# Patient Record
Sex: Female | Born: 1965 | Race: White | Hispanic: No | Smoking: Never smoker
Health system: Southern US, Community
[De-identification: ages and names within clinical notes are randomized; demographics above are authoritative.]

## PROBLEM LIST (undated history)

## (undated) DIAGNOSIS — F410 Panic disorder [episodic paroxysmal anxiety] without agoraphobia: Secondary | ICD-10-CM

## (undated) DIAGNOSIS — F419 Anxiety disorder, unspecified: Secondary | ICD-10-CM

## (undated) DIAGNOSIS — K589 Irritable bowel syndrome without diarrhea: Secondary | ICD-10-CM

## (undated) DIAGNOSIS — F32A Depression, unspecified: Secondary | ICD-10-CM

## (undated) DIAGNOSIS — R0602 Shortness of breath: Secondary | ICD-10-CM

## (undated) DIAGNOSIS — I10 Essential (primary) hypertension: Secondary | ICD-10-CM

## (undated) DIAGNOSIS — J069 Acute upper respiratory infection, unspecified: Secondary | ICD-10-CM

## (undated) DIAGNOSIS — Z889 Allergy status to unspecified drugs, medicaments and biological substances status: Secondary | ICD-10-CM

## (undated) DIAGNOSIS — R252 Cramp and spasm: Secondary | ICD-10-CM

## (undated) DIAGNOSIS — T7840XA Allergy, unspecified, initial encounter: Secondary | ICD-10-CM

## (undated) DIAGNOSIS — F329 Major depressive disorder, single episode, unspecified: Secondary | ICD-10-CM

## (undated) HISTORY — DX: Allergy, unspecified, initial encounter: T78.40XA

## (undated) HISTORY — PX: WISDOM TOOTH EXTRACTION: SHX21

## (undated) HISTORY — DX: Depression, unspecified: F32.A

## (undated) HISTORY — PX: RHINOPLASTY: SUR1284

## (undated) HISTORY — DX: Major depressive disorder, single episode, unspecified: F32.9

## (undated) HISTORY — PX: TUBAL LIGATION: SHX77

## (undated) HISTORY — PX: DILATION AND CURETTAGE OF UTERUS: SHX78

---

## 1998-10-04 ENCOUNTER — Emergency Department (HOSPITAL_COMMUNITY): Admission: EM | Admit: 1998-10-04 | Discharge: 1998-10-04 | Payer: Self-pay | Admitting: Emergency Medicine

## 1999-03-09 ENCOUNTER — Emergency Department (HOSPITAL_COMMUNITY): Admission: EM | Admit: 1999-03-09 | Discharge: 1999-03-09 | Payer: Self-pay | Admitting: Emergency Medicine

## 2006-12-06 ENCOUNTER — Other Ambulatory Visit: Admission: RE | Admit: 2006-12-06 | Discharge: 2006-12-06 | Payer: Self-pay | Admitting: Gynecology

## 2007-04-06 ENCOUNTER — Other Ambulatory Visit: Admission: RE | Admit: 2007-04-06 | Discharge: 2007-04-06 | Payer: Self-pay | Admitting: Gynecology

## 2008-01-03 ENCOUNTER — Encounter: Admission: RE | Admit: 2008-01-03 | Discharge: 2008-01-03 | Payer: Self-pay | Admitting: Internal Medicine

## 2008-01-10 ENCOUNTER — Other Ambulatory Visit: Admission: RE | Admit: 2008-01-10 | Discharge: 2008-01-10 | Payer: Self-pay | Admitting: Gynecology

## 2008-09-13 ENCOUNTER — Ambulatory Visit: Payer: Self-pay | Admitting: Internal Medicine

## 2008-10-02 ENCOUNTER — Ambulatory Visit: Payer: Self-pay | Admitting: Internal Medicine

## 2009-10-04 ENCOUNTER — Ambulatory Visit: Payer: Self-pay | Admitting: Internal Medicine

## 2009-10-28 ENCOUNTER — Encounter: Admission: RE | Admit: 2009-10-28 | Discharge: 2009-11-20 | Payer: Self-pay | Admitting: Endocrinology

## 2009-12-16 ENCOUNTER — Encounter: Admission: RE | Admit: 2009-12-16 | Discharge: 2010-03-16 | Payer: Self-pay | Admitting: Endocrinology

## 2010-02-17 ENCOUNTER — Ambulatory Visit: Payer: Self-pay | Admitting: Internal Medicine

## 2010-08-13 DIAGNOSIS — R0602 Shortness of breath: Secondary | ICD-10-CM

## 2010-08-13 DIAGNOSIS — R5383 Other fatigue: Secondary | ICD-10-CM

## 2010-08-13 DIAGNOSIS — L659 Nonscarring hair loss, unspecified: Secondary | ICD-10-CM | POA: Insufficient documentation

## 2010-08-13 DIAGNOSIS — R635 Abnormal weight gain: Secondary | ICD-10-CM

## 2010-08-13 DIAGNOSIS — R5381 Other malaise: Secondary | ICD-10-CM

## 2010-08-13 DIAGNOSIS — I10 Essential (primary) hypertension: Secondary | ICD-10-CM | POA: Insufficient documentation

## 2010-08-13 DIAGNOSIS — J45909 Unspecified asthma, uncomplicated: Secondary | ICD-10-CM | POA: Insufficient documentation

## 2010-08-13 DIAGNOSIS — R062 Wheezing: Secondary | ICD-10-CM

## 2010-08-13 DIAGNOSIS — R002 Palpitations: Secondary | ICD-10-CM

## 2010-08-13 DIAGNOSIS — R197 Diarrhea, unspecified: Secondary | ICD-10-CM

## 2010-08-13 DIAGNOSIS — K589 Irritable bowel syndrome without diarrhea: Secondary | ICD-10-CM

## 2010-08-13 DIAGNOSIS — E119 Type 2 diabetes mellitus without complications: Secondary | ICD-10-CM

## 2010-08-13 DIAGNOSIS — R609 Edema, unspecified: Secondary | ICD-10-CM

## 2010-08-13 DIAGNOSIS — R109 Unspecified abdominal pain: Secondary | ICD-10-CM

## 2010-08-14 ENCOUNTER — Ambulatory Visit: Payer: Self-pay | Admitting: Cardiology

## 2010-08-14 DIAGNOSIS — R079 Chest pain, unspecified: Secondary | ICD-10-CM | POA: Insufficient documentation

## 2010-08-22 ENCOUNTER — Telehealth (INDEPENDENT_AMBULATORY_CARE_PROVIDER_SITE_OTHER): Payer: Self-pay | Admitting: *Deleted

## 2010-09-11 ENCOUNTER — Ambulatory Visit (HOSPITAL_COMMUNITY): Admission: RE | Admit: 2010-09-11 | Discharge: 2010-09-11 | Payer: Self-pay | Admitting: Obstetrics and Gynecology

## 2010-09-19 ENCOUNTER — Ambulatory Visit: Payer: Self-pay | Admitting: Cardiology

## 2010-11-05 ENCOUNTER — Encounter
Admission: RE | Admit: 2010-11-05 | Discharge: 2010-11-05 | Payer: Self-pay | Source: Home / Self Care | Attending: Endocrinology | Admitting: Endocrinology

## 2010-12-25 NOTE — Progress Notes (Signed)
Summary: event monitor  Phone Note Outgoing Call Call back at [facility] Phone [phone]   Call placed by: Stanton Kidney, EMT-P,  August 22, 2010 1:37 PM Summary of Call: Left message for Pt. to call to schedule for event monitor. Left message for pt ref: event monitor. Stanton Kidney, EMT-P  September 16, 2010 9:16 AM  S/W pt, mail event monitor requested by pt, enrolled 09/19/10, Stanton Kidney, EMT-P  September 19, 2010 12:24 PM

## 2010-12-25 NOTE — Assessment & Plan Note (Signed)
Summary: np6/family hx of Cardiovascular diease/jml   Visit Type:  new pt visit Referring Provider:  Dr. Henderson Cloud Primary Provider:  [facility]  CC:  pt states she is going to have another D&C w/Dr. Henderson Cloud Oct 20 and 2011 at [facility]......chest tightness...sob w/stairs....pt states her legs hurt when she walks upstairs...edema/feet at times......  History of Present Illness: Janet Ware comes in today for consultation for chest discomfort and palpitations.  She is 45 years of age. She is noticed in the past some palpitations and some chest pressure. The palpitations are usually at random. She is noted on several occasions where she awakes and her heart rates running 168 on her monitor. It appears to be regular. She doesn't recall her breaking quickly.  She is also some chest pressure without radiation. This is usually at rest but cannot occasion occur with exertion. She does try to exercise a couple times a week.  Her risk factors for cardiac disease include remote smoking in college, obesity, and family history of coronary disease. Specifically her father had a cardiac event and died at age 54.  She denies any history of hypertension. She was told not to long ago that her hemoglobin A1c was just above 6%.  She had a recent evaluation by Dr. [place] of cardiology. A treadmill was negative according to her. She was told to stay on the metoprolol which seems to help her palpitations.  She does not use any stimulants, but does have a little bit caffeine on occasion. Her thyroid was recently checked and was normal.   Current Medications (verified): 1)  Advair Diskus 250-50 Mcg/dose Aepb (Fluticasone-Salmeterol) .Marland Kitchen.. 1 Puff Two Times A Day 2)  Proair Hfa 108 (90 Base) Mcg/act Aers (Albuterol Sulfate) .... As Needed 3)  Lorazepam 0.5 Mg Tabs (Lorazepam) .... As Needed 4)  Metoprolol Succinate 25 Mg Xr24h-Tab (Metoprolol Succinate) .Marland Kitchen.. 1 Tab Once Daily 5)  Metformin Hcl 1000  Mg Tabs (Metformin Hcl) .Marland Kitchen.. 1 Tab Two Times A Day  Allergies (verified): No Known Drug Allergies  Past History:  Past Medical History: Last updated: 08/13/2010 HYPERTENSION (ICD-401.9) PALPITATIONS (ICD-785.1) FATIGUE (ICD-780.79) DYSPNEA (ICD-786.05) EDEMA (ICD-782.3) DIARRHEA (ICD-787.91) WHEEZING (ICD-786.07) WEIGHT GAIN (ICD-783.1) HAIR LOSS (ICD-704.00) ASTHMA (ICD-493.90) DIABETES MELLITUS, TYPE II (ICD-250.00) IBS (ICD-564.1) PELVIC  PAIN (ICD-789.09)    Past Surgical History: Last updated: 08/13/2010 Hysteroscopy/D&C Endometrial Bx Sonohysterogram  Family History: Last updated: 08/13/2010 Family History of Coronary Artery Disease:  Family History of Cancer:  Colon  Social History: Last updated: 08/13/2010 Divorced  Tobacco Use - No.  Alcohol Use - yes Drug Use - no  Risk Factors: Smoking Status: never (08/13/2010)  Review of Systems       negative other than history of present illness  Vital Signs:  Patient profile:   45 year old female Height:      67 inches Weight:      190.8 pounds BMI:     29.99 Pulse rate:   86 / minute Pulse rhythm:   regular BP sitting:   128 / 74  (left arm) Cuff size:   large  Vitals Entered By: Danielle Rankin, CMA (August 14, 2010 10:51 AM)  Physical Exam  General:  overweight, pleasant, no acute distress Head:  normocephalic and atraumatic Eyes:  PERRLA/EOM intact; conjunctiva and lids normal. Neck:  Neck supple, no JVD. No masses, thyromegaly or abnormal cervical nodes. Chest Wall:  no deformities or breast masses noted Lungs:  Clear bilaterally to auscultation and percussion. Heart:  PMI  nondisplaced, normal S1-S2, no murmur or gallop. Carotids equal bilaterally without bruits Abdomen:  Bowel sounds positive; abdomen soft and non-tender without masses, organomegaly, or hernias noted. No hepatosplenomegaly. Msk:  Back normal, normal gait. Muscle strength and tone normal. Pulses:  pulses normal in all 4  extremities Extremities:  No clubbing or cyanosis. Neurologic:  Alert and oriented x 3. Skin:  Intact without lesions or rashes. Psych:  Normal affect.   Problems:  Medical Problems Added: 1)  Dx of Chest Pain-unspecified  (ICD-786.50)  EKG  Procedure date:  08/14/2010  Findings:      normal sinus rhythm, normal EKG  Impression & Recommendations:  Problem # 1:  PALPITATIONS (ICD-785.1) Assessment Deteriorated The episode she is at night which are now occurring weekly may be SVT. We'll place an event recorder and we capture one. I've advised her to increase her metoprolol to 50 mg a day if the palpitations become worse. Her updated medication list for this problem includes:    Metoprolol Succinate 25 Mg Xr24h-tab (Metoprolol succinate) .Marland Kitchen... 1 tab once daily  Orders: Holter Monitor (Holter Monitor)  Problem # 2:  CHEST PAIN-UNSPECIFIED (ICD-786.50) Assessment: New I doubt this is coronary. I reviewed the symptoms of angina and ischemic equivalence. Her weight is a major factor down the road. Advised to keep her hemoglobin A1c as low as possible. The last one was below 6%. Regular exercise also important 3 hours a week. Her lipids she has been told by endocrinology are fine. Reassurance given Her updated medication list for this problem includes:    Metoprolol Succinate 25 Mg Xr24h-tab (Metoprolol succinate) .Marland Kitchen... 1 tab once daily  Patient Instructions: 1)  Your physician recommends that you schedule a follow-up appointment as needed  2)  Your physician recommends that you continue on your current medications as directed. Please refer to the Current Medication list given to you today. 3)  Your physician has recommended that you wear an event monitor.  Event monitors are medical devices that record the heart's electrical activity. Doctors most often use these monitors to diagnose arrhythmias. Arrhythmias are problems with the speed or rhythm of the heartbeat. The monitor is a  small, portable device. You can wear one while you do your normal daily activities. This is usually used to diagnose what is causing palpitations/syncope (passing out).

## 2011-02-04 LAB — HCG, SERUM, QUALITATIVE: Preg, Serum: NEGATIVE

## 2011-02-05 LAB — SURGICAL PCR SCREEN
MRSA, PCR: NEGATIVE
Staphylococcus aureus: NEGATIVE

## 2011-02-05 LAB — CBC
HCT: 40.2 % (ref 36.0–46.0)
Hemoglobin: 13.7 g/dL (ref 12.0–15.0)
MCH: 32.7 pg (ref 26.0–34.0)
MCHC: 34 g/dL (ref 30.0–36.0)
RDW: 12.6 % (ref 11.5–15.5)

## 2011-02-05 LAB — COMPREHENSIVE METABOLIC PANEL
CO2: 28 mEq/L (ref 19–32)
Calcium: 9 mg/dL (ref 8.4–10.5)
Creatinine, Ser: 0.62 mg/dL (ref 0.4–1.2)
GFR calc non Af Amer: >60 mL/min (ref >60)
Glucose, Bld: 157 mg/dL — ABNORMAL HIGH (ref 70–99)
Total Protein: 7.2 g/dL (ref 6.0–8.3)

## 2011-09-01 NOTE — Patient Instructions (Addendum)
   Your procedure is scheduled on:  Wednesday, Oct. 17, 2012 at 8:30am  Enter through the [employer] of [facility] at: 7:00am Pick up the phone at the desk and dial [phone] and inform us of your arrival.  Please call this number if you have any problems the morning of surgery: [phone]  Remember: Do not eat food after midnight:   Tuesday Do not drink clear liquids after:  Tuesday Take these medicines the morning of surgery with a SIP OF WATER:  Per anesthesia instructions.  Do not wear jewelry, make-up, or FINGER nail polish Do not wear lotions, powders, or perfumes.  You may wear deodorant on the day of surgery. Do not shave 48 hours prior to surgery. Do not bring valuables to the hospital.  Patients discharged on the day of surgery will not be allowed to drive home.   Name and phone number of your driver:  Joette Catching  [phone]   Remember to use your hibiclens as instructed.Please shower with 1/2 bottle the evening before your surgery and the other 1/2 bottle the morning of surgery.

## 2011-09-02 ENCOUNTER — Encounter (HOSPITAL_COMMUNITY): Payer: Self-pay

## 2011-09-02 ENCOUNTER — Other Ambulatory Visit: Payer: Self-pay

## 2011-09-02 ENCOUNTER — Encounter (HOSPITAL_COMMUNITY)
Admission: RE | Admit: 2011-09-02 | Discharge: 2011-09-02 | Disposition: A | Discharge status: Home / Self Care | Payer: 59 | Source: Ambulatory Visit | Attending: Obstetrics and Gynecology | Admitting: Obstetrics and Gynecology

## 2011-09-02 HISTORY — DX: Essential (primary) hypertension: I10

## 2011-09-02 HISTORY — DX: Shortness of breath: R06.02

## 2011-09-02 HISTORY — DX: Cramp and spasm: R25.2

## 2011-09-02 HISTORY — DX: Acute upper respiratory infection, unspecified: J06.9

## 2011-09-02 HISTORY — DX: Anxiety disorder, unspecified: F41.9

## 2011-09-02 HISTORY — DX: Irritable bowel syndrome, unspecified: K58.9

## 2011-09-02 HISTORY — DX: Allergy status to unspecified drugs, medicaments and biological substances: Z88.9

## 2011-09-02 HISTORY — DX: Panic disorder (episodic paroxysmal anxiety): F41.0

## 2011-09-02 LAB — BASIC METABOLIC PANEL
BUN: 6 mg/dL (ref 6–23)
CO2: 28 mEq/L (ref 19–32)
Chloride: 102 mEq/L (ref 96–112)
GFR calc Af Amer: >90 mL/min (ref >90)
Glucose, Bld: 125 mg/dL — ABNORMAL HIGH (ref 70–99)
Potassium: 3.9 mEq/L (ref 3.5–5.1)

## 2011-09-02 LAB — CBC
HCT: 40.3 % (ref 36.0–46.0)
Hemoglobin: 13.4 g/dL (ref 12.0–15.0)
MCV: 95.7 fL (ref 78.0–100.0)
RBC: 4.21 MIL/uL (ref 3.87–5.11)
WBC: 8.7 10*3/uL (ref 4.0–10.5)

## 2011-09-02 NOTE — Pre-Procedure Instructions (Signed)
Reviewed with Dr Hector Shade, patient's history and medicines.  EKG done, on chart.  Per Dr Edison Pace instructions, I instructed patient which meds to take DOS.  Pt. Will withhold Metformin on Tues and Wed - DOS per Dr Edison Pace orders.  Pt will also bring inhaler.

## 2011-09-09 ENCOUNTER — Ambulatory Visit (HOSPITAL_COMMUNITY): Payer: 59 | Admitting: Anesthesiology

## 2011-09-09 ENCOUNTER — Encounter (HOSPITAL_COMMUNITY)
Admission: RE | Disposition: A | Discharge status: Home / Self Care | Payer: Self-pay | Source: Ambulatory Visit | Attending: Obstetrics and Gynecology

## 2011-09-09 ENCOUNTER — Other Ambulatory Visit: Payer: Self-pay | Admitting: Obstetrics and Gynecology

## 2011-09-09 ENCOUNTER — Encounter (HOSPITAL_COMMUNITY): Payer: Self-pay | Admitting: Anesthesiology

## 2011-09-09 ENCOUNTER — Encounter (HOSPITAL_COMMUNITY): Payer: Self-pay | Admitting: *Deleted

## 2011-09-09 ENCOUNTER — Ambulatory Visit (HOSPITAL_COMMUNITY)
Admission: RE | Admit: 2011-09-09 | Discharge: 2011-09-09 | Disposition: A | Discharge status: Home / Self Care | Payer: 59 | Source: Ambulatory Visit | Attending: Obstetrics and Gynecology | Admitting: Obstetrics and Gynecology

## 2011-09-09 DIAGNOSIS — N840 Polyp of corpus uteri: Secondary | ICD-10-CM | POA: Insufficient documentation

## 2011-09-09 DIAGNOSIS — Q5128 Other doubling of uterus, other specified: Secondary | ICD-10-CM | POA: Insufficient documentation

## 2011-09-09 DIAGNOSIS — N920 Excessive and frequent menstruation with regular cycle: Secondary | ICD-10-CM | POA: Insufficient documentation

## 2011-09-09 LAB — GLUCOSE, CAPILLARY: Glucose-Capillary: 122 mg/dL — ABNORMAL HIGH (ref 70–99)

## 2011-09-09 SURGERY — DILATATION & CURETTAGE/HYSTEROSCOPY WITH RESECTOCOPE
Anesthesia: General | Site: Vagina | Wound class: Clean Contaminated

## 2011-09-09 MED ORDER — SCOPOLAMINE 1 MG/3DAYS TD PT72
1.0000 | MEDICATED_PATCH | TRANSDERMAL | Status: DC
  Administered 2011-09-09: 1.5 mg via TRANSDERMAL

## 2011-09-09 MED ORDER — MEPERIDINE HCL 25 MG/ML IJ SOLN: 6.2500 mg | INTRAMUSCULAR | Status: DC | PRN

## 2011-09-09 MED ORDER — FENTANYL CITRATE 0.05 MG/ML IJ SOLN
INTRAMUSCULAR | Status: AC
  Filled 2011-09-09: qty 2

## 2011-09-09 MED ORDER — MIDAZOLAM HCL 5 MG/5ML IJ SOLN
INTRAMUSCULAR | Status: DC | PRN
  Administered 2011-09-09: 2 mg via INTRAVENOUS

## 2011-09-09 MED ORDER — KETOROLAC TROMETHAMINE 30 MG/ML IJ SOLN
INTRAMUSCULAR | Status: DC | PRN
  Administered 2011-09-09: 30 mg via INTRAVENOUS

## 2011-09-09 MED ORDER — FENTANYL CITRATE 0.05 MG/ML IJ SOLN: 25.0000 ug | INTRAMUSCULAR | Status: DC | PRN

## 2011-09-09 MED ORDER — DEXAMETHASONE SODIUM PHOSPHATE 10 MG/ML IJ SOLN
INTRAMUSCULAR | Status: DC | PRN
  Administered 2011-09-09: 10 mg via INTRAVENOUS

## 2011-09-09 MED ORDER — SCOPOLAMINE 1 MG/3DAYS TD PT72
MEDICATED_PATCH | TRANSDERMAL | Status: AC
  Filled 2011-09-09: qty 1

## 2011-09-09 MED ORDER — GLYCINE 1.5 % IR SOLN
Status: DC | PRN
  Administered 2011-09-09: 3000 mL

## 2011-09-09 MED ORDER — PROPOFOL 10 MG/ML IV EMUL
INTRAVENOUS | Status: DC | PRN
  Administered 2011-09-09: 150 mg via INTRAVENOUS

## 2011-09-09 MED ORDER — LIDOCAINE HCL (CARDIAC) 20 MG/ML IV SOLN
INTRAVENOUS | Status: AC
  Filled 2011-09-09: qty 5

## 2011-09-09 MED ORDER — METOCLOPRAMIDE HCL 5 MG/ML IJ SOLN: 10.0000 mg | Freq: Once | INTRAMUSCULAR | Status: DC | PRN

## 2011-09-09 MED ORDER — ONDANSETRON HCL 4 MG/2ML IJ SOLN
INTRAMUSCULAR | Status: AC
  Filled 2011-09-09: qty 2

## 2011-09-09 MED ORDER — ONDANSETRON HCL 4 MG/2ML IJ SOLN
INTRAMUSCULAR | Status: DC | PRN
  Administered 2011-09-09: 4 mg via INTRAVENOUS

## 2011-09-09 MED ORDER — PROPOFOL 10 MG/ML IV EMUL
INTRAVENOUS | Status: AC
  Filled 2011-09-09: qty 20

## 2011-09-09 MED ORDER — LIDOCAINE HCL 1 % IJ SOLN
INTRAMUSCULAR | Status: DC | PRN
  Administered 2011-09-09: 20 mL

## 2011-09-09 MED ORDER — CEFAZOLIN SODIUM 1-5 GM-% IV SOLN
1.0000 g | INTRAVENOUS | Status: AC
  Administered 2011-09-09: 1 g via INTRAVENOUS

## 2011-09-09 MED ORDER — CEFAZOLIN SODIUM 1-5 GM-% IV SOLN
INTRAVENOUS | Status: AC
  Filled 2011-09-09: qty 50

## 2011-09-09 MED ORDER — MIDAZOLAM HCL 2 MG/2ML IJ SOLN
INTRAMUSCULAR | Status: AC
  Filled 2011-09-09: qty 2

## 2011-09-09 MED ORDER — KETOROLAC TROMETHAMINE 30 MG/ML IJ SOLN
INTRAMUSCULAR | Status: AC
  Filled 2011-09-09: qty 1

## 2011-09-09 MED ORDER — DEXAMETHASONE SODIUM PHOSPHATE 10 MG/ML IJ SOLN
INTRAMUSCULAR | Status: AC
  Filled 2011-09-09: qty 1

## 2011-09-09 MED ORDER — LIDOCAINE HCL (CARDIAC) 20 MG/ML IV SOLN
INTRAVENOUS | Status: DC | PRN
  Administered 2011-09-09: 60 mg via INTRAVENOUS

## 2011-09-09 MED ORDER — FENTANYL CITRATE 0.05 MG/ML IJ SOLN
INTRAMUSCULAR | Status: DC | PRN
  Administered 2011-09-09: 100 ug via INTRAVENOUS

## 2011-09-09 MED ORDER — LACTATED RINGERS IV SOLN
INTRAVENOUS | Status: DC
  Administered 2011-09-09 (×2): via INTRAVENOUS

## 2011-09-09 SURGICAL SUPPLY — 13 items
CATH ROBINSON RED A/P 16FR (CATHETERS) ×3 IMPLANT
CLOTH BEACON ORANGE TIMEOUT ST (SAFETY) ×3 IMPLANT
CONTAINER PREFILL 10% NBF 60ML (FORM) ×6 IMPLANT
DRAPE CAMERA CLOSED 9X96 (DRAPES) ×3 IMPLANT
DRAPE HYSTEROSCOPY (DRAPE) ×3 IMPLANT
DRAPE UTILITY XL STRL (DRAPES) ×1 IMPLANT
GLOVE BIO SURGEON STRL SZ8 (GLOVE) ×6 IMPLANT
GOWN PREVENTION PLUS LG XLONG (DISPOSABLE) ×3 IMPLANT
GOWN STRL REIN XL XLG (GOWN DISPOSABLE) ×3 IMPLANT
PACK VAGINAL MINOR WOMEN LF (CUSTOM PROCEDURE TRAY) ×3 IMPLANT
SET GENESYS HTA PROCERVA (MISCELLANEOUS) ×3 IMPLANT
TOWEL OR 17X24 6PK STRL BLUE (TOWEL DISPOSABLE) ×6 IMPLANT
TUBING HYDROFLEX HYSTEROSCOPY (TUBING) ×2 IMPLANT

## 2011-09-09 NOTE — H&P (Signed)
Janet Ware is an 45 y.o. female. With recurrent heavy menses. U/S c/w probable endometrial polyps. Patient has known septate uterus.  Pertinent Gynecological History: Menses: flow is excessive with use of many pads or tampons on heaviest days Bleeding: none Contraception: tubal ligation DES exposure: unknown Blood transfusions: none Sexually transmitted diseases: no past history Previous GYN Procedures: hysteroscopy with resection of benign endometrial polyps '11  Last mammogram: normal Date: unknown Last pap: normal Date: unknown OB History: G0, P0   Menstrual History: Menarche age: unknown No LMP recorded.    Past Medical History  Diagnosis Date  . Hypertension   . Diabetes mellitus   . Asthma   . Anxiety   . Multiple allergies   . Shortness of breath   . Recurrent upper respiratory infection (URI)     3 wks ago- finished abx  . Muscle cramps at night   . Panic attacks   . IBS (irritable bowel syndrome)     Past Surgical History  Procedure Date  . Tubal ligation   . Dilation and curettage of uterus     x 2  . Rhinoplasty   . Wisdom tooth extraction     History reviewed. No pertinent family history.  Social History:  reports that she has never smoked. She has never used smokeless tobacco. She reports that she drinks about 9 ounces of alcohol per week. She reports that she does not use illicit drugs.  Allergies: No Known Allergies  Prescriptions prior to admission  Medication Sig Dispense Refill  . albuterol (PROVENTIL HFA;VENTOLIN HFA) 108 (90 BASE) MCG/ACT inhaler Inhale 2 puffs into the lungs every 4 (four) hours as needed. Shortness of breath       . amphetamine-dextroamphetamine (ADDERALL) 10 MG tablet Take 10 mg by mouth daily.        . cetirizine (ZYRTEC) 10 MG tablet Take 10 mg by mouth daily.        . fluticasone (FLONASE) 50 MCG/ACT nasal spray Place 2 sprays into the nose daily.        Marland Kitchen ibuprofen (ADVIL,MOTRIN) 200 MG tablet Take 800 mg by mouth  every 8 (eight) hours as needed. For menstrual cramps       . LORazepam (ATIVAN) 1 MG tablet Take 1 mg by mouth daily as needed. For anxiety       . metFORMIN (GLUCOPHAGE-XR) 500 MG 24 hr tablet Take 1,000 mg by mouth 2 (two) times daily with a meal.       . metoprolol tartrate (LOPRESSOR) 25 MG tablet Take 25 mg by mouth daily.        . mometasone-formoterol (DULERA) 100-5 MCG/ACT AERO Inhale 2 puffs into the lungs 2 (two) times daily.        . Pediatric Multivit-Minerals-C (CHILDRENS GUMMIES PO) Take 1 tablet by mouth daily.        Marland Kitchen therapeutic multivitamin (THERA-PLUS) LIQD Take 5 mLs by mouth daily.         Review of Systems  Constitutional: Negative for fever.  Respiratory: Negative for shortness of breath.   Cardiovascular: Negative for chest pain.  Skin: Positive for rash.       Rash on groin x 2-3 days after exercise.    Blood pressure 114/77, pulse 91, temperature 98.1 F (36.7 C), temperature source Oral, resp. rate 18, SpO2 100.00%. Physical Exam  Constitutional: She appears well-developed and well-nourished.  Cardiovascular: Normal rate and regular rhythm.   Respiratory: Effort normal and breath sounds normal.  GI: Bowel sounds  are normal. There is no tenderness.  Neurological: She has normal reflexes.  Skin: Skin is warm and dry.    Results for orders placed during the hospital encounter of 09/09/11 (from the past 24 hour(s))  GLUCOSE, CAPILLARY     Status: Abnormal   Collection Time   09/09/11  7:48 AM      Component Value Range   Glucose-Capillary 134 (*) 70 - 99 (mg/dL)    No results found.  Assessment/Plan: 45 yo S/P BTL with recurrent heavy menses and uterine septum.  D/W patient Hysteroscopy with resectoscope, D&C, risks, benefits. All questions answered.  Walda Hertzog II,Javonnie Illescas E 09/09/2011, 8:40 AM

## 2011-09-09 NOTE — Anesthesia Preprocedure Evaluation (Addendum)
Anesthesia Evaluation  Name, MR# and DOB Patient awake  General Assessment Comment  Reviewed: Allergy & Precautions, H&P , NPO status , Patient's Chart, lab work & pertinent test results, reviewed documented beta blocker date and time   Airway Mallampati: II TM Distance: >3 FB Neck ROM: full    Dental No notable dental hx. (+) Teeth Intact   Pulmonary (+) shortness of breath and With exertion asthma Recent URI   clear to auscultation  Pulmonary exam normal       Cardiovascular hypertension, regular Normal    Neuro/Psych Negative Neurological ROS  Negative Psych ROS   GI/Hepatic negative GI ROS Neg liver ROS    Endo/Other  Diabetes mellitus-, Well Controlled, Type 2, Oral Hypoglycemic Agents  Renal/GU negative Renal ROS  Genitourinary negative   Musculoskeletal   Abdominal   Peds  Hematology negative hematology ROS (+)   Anesthesia Other Findings   Reproductive/Obstetrics negative OB ROS                          Anesthesia Physical Anesthesia Plan  ASA: II  Anesthesia Plan: General   Post-op Pain Management:    Induction:   Airway Management Planned:   Additional Equipment:   Intra-op Plan:   Post-operative Plan:   Informed Consent: I have reviewed the patients History and Physical, chart, labs and discussed the procedure including the risks, benefits and alternatives for the proposed anesthesia with the patient or authorized representative who has indicated his/her understanding and acceptance.   Dental Advisory Given  Plan Discussed with: Anesthesiologist and CRNA  Anesthesia Plan Comments:         Anesthesia Quick Evaluation

## 2011-09-09 NOTE — Op Note (Signed)
NAMETWYLIA, OKA NO.:  [id number]  MEDICAL RECORD NO.:  [id number]  LOCATION:  [facility]                          FACILITY:  [facility]  PHYSICIAN:  Guy Sandifer. Henderson Cloud, M.D. DATE OF BIRTH:  [birth year]  DATE OF PROCEDURE:  09/09/2011 DATE OF DISCHARGE:  09/09/2011                              OPERATIVE REPORT   PREOPERATIVE DIAGNOSIS:  Menorrhagia.  POSTOPERATIVE DIAGNOSIS:  Menorrhagia.  PROCEDURE:  Hysteroscopy with resection of endometrial masses. Dilatation and curettage.  SURGEON:  Guy Sandifer. Henderson Cloud, MD  ANESTHESIA:  General with LMA.  ESTIMATED BLOOD LOSS:  Drops.  INPUT AND OUTPUT:  Distending media 580 mL deficit with the vast majority of that being on the floor.  INDICATIONS AND CONSENT:  This patient is a 45 year old patient status post bilateral tubal ligation, with known uterine septum.  Approximately a year ago, she had hysteroscopy, D and C for benign endometrial polyps. She has had recurrent heavy bleeding.  An ultrasound was again consistent with probable endometrial polyps.  Hysteroscopy, resectoscope, D and C has been discussed preoperatively.  Potential risks and complications were reviewed preoperatively including not limited to, infection, uterine perforation, organ damage, bleeding requiring transfusion of blood products with HIV and hepatitis acquisition, DVT, PE, pneumonia.  Success and failure rates, recurrent heavy bleeding, pelvic pain, painful intercourse, painful menses have also been reviewed.  We initially had discussed hydrothermal endometrial ablation.  After careful discussion of the risks and benefits, the patient declined this procedure.  FINDINGS:  There is a broad uterine septum down to within 1-2 cm of the internal cervical os.  The fallopian tube ostia identified on each side of the septum.  The left side of the septum is without abnormal structures.  On the right side, there is an approximately 8-mm  polypoid pedunculated-type structure on the anterior midportion of the endometrial canal.  There is a broad-based polypoid type mass on the posterior upper endometrial part of the cavity.  PROCEDURE:  The patient was taken to the operating room where she was identified, placed in dorsal supine position, and  general anesthesia was induced via LMA.  She was placed in a dorsal lithotomy position. Time-out was undertaken.  She was prepped, bladder straight catheterized, and she was draped in sterile fashion.  Bivalve speculum was placed in the vagina.  Anterior cervical lip was injected with 1% plain Xylocaine and grasped with single-tooth tenaculum.  Paracervical block was placed at 2, 4, 5, 7, 8, 10 o'clock positions with approximately 20 mL of the same solution.  Cervix was gently progressively dilated.  Hysteroscope was placed in the endocervical canal and advanced under direct visualization using distending media. The above findings were noted.  Hysteroscope was withdrawn.  Cervix was gently dilated.  Resectoscope with a single right-angle wire loop was then used and under good visualization polypoid masses were resected in a simple fashion without difficulty.  After treating these, sharp curettage was carried out on both sides.  Good hemostasis was noted. All instruments were removed.  All counts were correct.  The patient was awakened and taken to the recovery room in stable condition.     Guy Sandifer Henderson Cloud, M.D.  JET/MEDQ  D:  09/09/2011  T:  09/09/2011  Job:  [id number]

## 2011-09-09 NOTE — Transfer of Care (Signed)
Immediate Anesthesia Transfer of Care Note  Patient: Janet Ware  Procedure(s) Performed:  DILATATION & CURETTAGE/HYSTEROSCOPY WITH RESECTOSCOPE  Patient Location: PACU  Anesthesia Type: General  Level of Consciousness: alert , oriented and sedated  Airway & Oxygen Therapy: Patient Spontanous Breathing and Patient connected to nasal cannula oxygen  Post-op Assessment: Report given to PACU RN and Post -op Vital signs reviewed and stable  Post vital signs: stable  Complications: No apparent anesthesia complications

## 2011-09-09 NOTE — Anesthesia Postprocedure Evaluation (Signed)
Anesthesia Post Note  Patient: Janet Ware  Procedure(s) Performed:  DILATATION & CURETTAGE/HYSTEROSCOPY WITH RESECTOSCOPE  Anesthesia type: GA  Patient location: PACU  Post pain: Pain level controlled  Post assessment: Post-op Vital signs reviewed  Last Vitals:  Filed Vitals:   09/09/11 1045  BP: 107/71  Pulse: 76  Temp: 98.3 F (36.8 C)  Resp: 16    Post vital signs: Reviewed  Level of consciousness: sedated  Complications: No apparent anesthesia complications

## 2011-09-09 NOTE — Brief Op Note (Signed)
09/09/2011  9:33 AM  PATIENT:  Janet Ware  45 y.o. female with heavy menses and known septate uterus  PRE-OPERATIVE DIAGNOSIS:  menorrhagia  POST-OPERATIVE DIAGNOSIS:  menorrhagia  PROCEDURE:  Procedure(s): DILATATION & CURETTAGE/HYSTEROSCOPY WITH resection of endometrial masses  SURGEON:  Surgeon(s): Roselle Locus II  PHYSICIAN ASSISTANT:   ASSISTANTS: none   ANESTHESIA:   general and LMA  EBL:  Total I/O In: 1000 [I.V.:1000] Out: -   BLOOD ADMINISTERED:none  DRAINS: none   LOCAL MEDICATIONS USED:  LIDOCAINE 20CC  SPECIMEN:  Source of Specimen:  endometrial currettings and endometrial resections  DISPOSITION OF SPECIMEN:  PATHOLOGY  COUNTS:  YES  TOURNIQUET:  * No tourniquets in log *  DICTATION: .Other Dictation: Dictation Number [id number]  PLAN OF CARE: Discharge to home after PACU  PATIENT DISPOSITION:  PACU - hemodynamically stable.   Delay start of Pharmacological VTE agent (>24hrs) due to surgical blood loss or risk of bleeding:  not applicable

## 2012-07-12 ENCOUNTER — Other Ambulatory Visit: Payer: Self-pay | Admitting: Gynecology

## 2015-11-07 ENCOUNTER — Emergency Department (HOSPITAL_COMMUNITY)
Admission: EM | Admit: 2015-11-07 | Discharge: 2015-11-07 | Disposition: A | Discharge status: Home / Self Care | Payer: Self-pay | Attending: Emergency Medicine | Admitting: Emergency Medicine

## 2015-11-07 ENCOUNTER — Encounter (HOSPITAL_COMMUNITY): Payer: Self-pay | Admitting: *Deleted

## 2015-11-07 DIAGNOSIS — R739 Hyperglycemia, unspecified: Secondary | ICD-10-CM

## 2015-11-07 DIAGNOSIS — R631 Polydipsia: Secondary | ICD-10-CM

## 2015-11-07 DIAGNOSIS — R202 Paresthesia of skin: Secondary | ICD-10-CM

## 2015-11-07 DIAGNOSIS — R110 Nausea: Secondary | ICD-10-CM

## 2015-11-07 DIAGNOSIS — Z8719 Personal history of other diseases of the digestive system: Secondary | ICD-10-CM | POA: Insufficient documentation

## 2015-11-07 DIAGNOSIS — R000 Tachycardia, unspecified: Secondary | ICD-10-CM | POA: Insufficient documentation

## 2015-11-07 DIAGNOSIS — G6289 Other specified polyneuropathies: Secondary | ICD-10-CM | POA: Insufficient documentation

## 2015-11-07 DIAGNOSIS — J45909 Unspecified asthma, uncomplicated: Secondary | ICD-10-CM | POA: Insufficient documentation

## 2015-11-07 DIAGNOSIS — Z79899 Other long term (current) drug therapy: Secondary | ICD-10-CM | POA: Insufficient documentation

## 2015-11-07 DIAGNOSIS — E119 Type 2 diabetes mellitus without complications: Secondary | ICD-10-CM

## 2015-11-07 DIAGNOSIS — E1165 Type 2 diabetes mellitus with hyperglycemia: Secondary | ICD-10-CM | POA: Insufficient documentation

## 2015-11-07 DIAGNOSIS — F410 Panic disorder [episodic paroxysmal anxiety] without agoraphobia: Secondary | ICD-10-CM | POA: Insufficient documentation

## 2015-11-07 DIAGNOSIS — R632 Polyphagia: Secondary | ICD-10-CM

## 2015-11-07 DIAGNOSIS — Z9119 Patient's noncompliance with other medical treatment and regimen: Secondary | ICD-10-CM | POA: Insufficient documentation

## 2015-11-07 DIAGNOSIS — I10 Essential (primary) hypertension: Secondary | ICD-10-CM | POA: Insufficient documentation

## 2015-11-07 DIAGNOSIS — R358 Other polyuria: Secondary | ICD-10-CM

## 2015-11-07 DIAGNOSIS — Z7951 Long term (current) use of inhaled steroids: Secondary | ICD-10-CM | POA: Insufficient documentation

## 2015-11-07 DIAGNOSIS — R3589 Other polyuria: Secondary | ICD-10-CM

## 2015-11-07 LAB — CBC WITH DIFFERENTIAL/PLATELET
Basophils Absolute: 0 10*3/uL (ref 0.0–0.1)
Basophils Relative: 0 %
EOS ABS: 0.1 10*3/uL (ref 0.0–0.7)
Eosinophils Relative: 3 %
HCT: 36.2 % (ref 36.0–46.0)
HEMOGLOBIN: 11.5 g/dL — AB (ref 12.0–15.0)
LYMPHS ABS: 1.2 10*3/uL (ref 0.7–4.0)
LYMPHS PCT: 23 %
MCH: 27.4 pg (ref 26.0–34.0)
MCHC: 31.8 g/dL (ref 30.0–36.0)
MCV: 86.4 fL (ref 78.0–100.0)
MONOS PCT: 9 %
Monocytes Absolute: 0.5 10*3/uL (ref 0.1–1.0)
NEUTROS PCT: 65 %
Neutro Abs: 3.4 10*3/uL (ref 1.7–7.7)
Platelets: 285 10*3/uL (ref 150–400)
RBC: 4.19 MIL/uL (ref 3.87–5.11)
RDW: 13.4 % (ref 11.5–15.5)
WBC: 5.3 10*3/uL (ref 4.0–10.5)

## 2015-11-07 LAB — BASIC METABOLIC PANEL
Anion gap: 9 (ref 5–15)
BUN: 6 mg/dL (ref 6–20)
CHLORIDE: 103 mmol/L (ref 101–111)
CO2: 24 mmol/L (ref 22–32)
CREATININE: 0.59 mg/dL (ref 0.44–1.00)
Calcium: 9.4 mg/dL (ref 8.9–10.3)
GFR calc Af Amer: >60 mL/min (ref >60)
GFR calc non Af Amer: >60 mL/min (ref >60)
GLUCOSE: 324 mg/dL — AB (ref 65–99)
POTASSIUM: 4 mmol/L (ref 3.5–5.1)
Sodium: 136 mmol/L (ref 135–145)

## 2015-11-07 LAB — URINALYSIS, ROUTINE W REFLEX MICROSCOPIC
BILIRUBIN URINE: NEGATIVE
Glucose, UA: >1000 mg/dL — AB
HGB URINE DIPSTICK: NEGATIVE
Ketones, ur: 40 mg/dL — AB
Leukocytes, UA: NEGATIVE
NITRITE: NEGATIVE
PH: 6 (ref 5.0–8.0)
Protein, ur: NEGATIVE mg/dL
SPECIFIC GRAVITY, URINE: 1.035 — AB (ref 1.005–1.030)

## 2015-11-07 LAB — I-STAT VENOUS BLOOD GAS, ED
BICARBONATE: 24 meq/L (ref 20.0–24.0)
O2 SAT: 87 %
PO2 VEN: 51 mmHg — AB (ref 30.0–45.0)
TCO2: 25 mmol/L (ref 0–100)
pCO2, Ven: 36.3 mmHg — ABNORMAL LOW (ref 45.0–50.0)
pH, Ven: 7.427 — ABNORMAL HIGH (ref 7.250–7.300)

## 2015-11-07 LAB — URINE MICROSCOPIC-ADD ON: Bacteria, UA: NONE SEEN

## 2015-11-07 LAB — CBG MONITORING, ED: Glucose-Capillary: 284 mg/dL — ABNORMAL HIGH (ref 65–99)

## 2015-11-07 MED ORDER — CANAGLIFLOZIN 300 MG PO TABS: 300.0000 mg | ORAL_TABLET | Freq: Every day | ORAL | Status: DC

## 2015-11-07 MED ORDER — METFORMIN HCL 1000 MG PO TABS: 1000.0000 mg | ORAL_TABLET | Freq: Two times a day (BID) | ORAL | Status: DC

## 2015-11-07 MED ORDER — SODIUM CHLORIDE 0.9 % IV BOLUS (SEPSIS)
1000.0000 mL | Freq: Once | INTRAVENOUS | Status: AC
  Administered 2015-11-07: 1000 mL via INTRAVENOUS

## 2015-11-07 NOTE — ED Notes (Signed)
Pt presents via POV c/o inability to keep her blood sugar controlled.  Pt reports not taking her metformin and invocana x 6 months d/t insurance issues.  Reports fatigue, polyuria, polydipsia.  Pt a x 4, NAD.  CBG this AM 284.

## 2015-11-07 NOTE — ED Notes (Signed)
Patient d/c'd from IV, continuous pulse oximetry and blood pressure cuff; patient getting dressed to be discharged home 

## 2015-11-07 NOTE — ED Notes (Signed)
Pt is in stable condition upon d/c and ambulates from ED. 

## 2015-11-07 NOTE — ED Provider Notes (Signed)
CSN: [id number]     Arrival date & time 11/07/15  4098 History   First MD Initiated Contact with Patient 11/07/15 0745     Chief Complaint  Patient presents with  . Blood Sugar Problem     (Consider location/radiation/quality/duration/timing/severity/associated sxs/prior Treatment) HPI Comments: Janet Ware is a 49 y.o. female with a PMHx of HTN, DM2, PCOS, anxiety, asthma, IBS, and chronic SOB, who presents to the ED with complaints of hyperglycemia. Patient states that 6 months ago she lost her insurance and could no longer afford her metformin and invokana. She was previously controlled on metformin 1000 mg twice a day and in the, 300 mg daily, but since coming off of these medications, she has noticed that her sugars have been running higher, states that prior to arrival this morning it was 284, but that after meals it occasionally will go into the 450s to 600 range. She has not noticed a change with decreasing the carb intake in her diet. She is having symptoms of tingling in her toes, polydipsia, polyphagia, polyuria, nausea, and occasional blurred vision, which have all been ongoing since she stopped her medications.  She denies any fevers, chills, chest pain, shortness of breath, abdominal pain, vomiting, diarrhea, constipation, melena, hematochezia, dysuria, hematuria, lightheadedness, headaches, numbness, or weakness. LMP was 2 weeks ago.  Patient is a 49 y.o. female presenting with hyperglycemia. The history is provided by the patient. No language interpreter was used.  Hyperglycemia Blood sugar level PTA:  284 Severity:  Moderate Onset quality:  Gradual Duration:  6 months Timing:  Constant Progression:  Unchanged Chronicity:  Recurrent Current diabetic treatments: noncompliant with metformin and invokana. Current diabetic therapy:  NO LONGER TAKING Metformin  BID and Invokana  QD Time since last antidiabetic medication:  6 months Context: noncompliance   Relieved  by:  None tried Ineffective treatments:  Diet Associated symptoms: blurred vision, increased thirst, nausea and polyuria   Associated symptoms: no abdominal pain, no chest pain, no confusion, no dysuria, no fever, no shortness of breath, no vomiting, no weakness and no weight change     Past Medical History  Diagnosis Date  . Hypertension   . Diabetes mellitus   . Asthma   . Anxiety   . Multiple allergies   . Shortness of breath   . Recurrent upper respiratory infection (URI)     3 wks ago- finished abx  . Muscle cramps at night   . Panic attacks   . IBS (irritable bowel syndrome)    Past Surgical History  Procedure Laterality Date  . Tubal ligation    . Dilation and curettage of uterus      x 2  . Rhinoplasty    . Wisdom tooth extraction     No family history on file. Social History  Substance Use Topics  . Smoking status: Never Smoker   . Smokeless tobacco: Never Used  . Alcohol Use: 9.0 oz/week    15 Glasses of wine per week   OB History    No data available     Review of Systems  Constitutional: Negative for fever and chills.  Eyes: Positive for blurred vision and visual disturbance (occasionally blurry). Negative for pain.  Respiratory: Negative for shortness of breath.   Cardiovascular: Negative for chest pain.  Gastrointestinal: Positive for nausea. Negative for vomiting, abdominal pain, diarrhea, constipation and blood in stool.  Endocrine: Positive for polydipsia, polyphagia and polyuria.       +hyperglycemia  Genitourinary: Negative for dysuria  and hematuria.  Musculoskeletal: Negative for myalgias and arthralgias.  Skin: Negative for color change.  Allergic/Immunologic: Negative for immunocompromised state.  Neurological: Negative for weakness, light-headedness, numbness and headaches.       +tingling in toes  Psychiatric/Behavioral: Negative for confusion.   10 Systems reviewed and are negative for acute change except as noted in the HPI.     Allergies  Review of patient's allergies indicates no known allergies.  Home Medications   Prior to Admission medications   Medication Sig Start Date End Date Taking? Authorizing Provider  albuterol (PROVENTIL HFA;VENTOLIN HFA) 108 (90 BASE) MCG/ACT inhaler Inhale 2 puffs into the lungs every 4 (four) hours as needed. Shortness of breath     Historical Provider, MD  amphetamine-dextroamphetamine (ADDERALL) 10 MG tablet Take 10 mg by mouth daily.      Historical Provider, MD  cetirizine (ZYRTEC) 10 MG tablet Take 10 mg by mouth daily.      Historical Provider, MD  fluticasone (FLONASE) 50 MCG/ACT nasal spray Place 2 sprays into the nose daily.      Historical Provider, MD  ibuprofen (ADVIL,MOTRIN) 200 MG tablet Take 800 mg by mouth every 8 (eight) hours as needed. For menstrual cramps     Historical Provider, MD  LORazepam (ATIVAN) 1 MG tablet Take 1 mg by mouth daily as needed. For anxiety     Historical Provider, MD  metFORMIN (GLUCOPHAGE-XR) 500 MG 24 hr tablet Take 1,000 mg by mouth 2 (two) times daily with a meal.     Historical Provider, MD  metoprolol tartrate (LOPRESSOR) 25 MG tablet Take 25 mg by mouth daily.      Historical Provider, MD  mometasone-formoterol (DULERA) 100-5 MCG/ACT AERO Inhale 2 puffs into the lungs 2 (two) times daily.      Historical Provider, MD  Pediatric Multivit-Minerals-C (CHILDRENS GUMMIES PO) Take 1 tablet by mouth daily.      Historical Provider, MD  therapeutic multivitamin (THERA-PLUS) LIQD Take 5 mLs by mouth daily.     Historical Provider, MD   BP 154/102 mmHg  Pulse 103  Temp(Src) 98.9 F (37.2 C) (Oral)  Resp 18  SpO2 98%  LMP 10/24/2015 Physical Exam  Constitutional: She is oriented to person, place, and time. Vital signs are normal. She appears well-developed and well-nourished.  Non-toxic appearance. No distress.  Afebrile, nontoxic, NAD  HENT:  Head: Normocephalic and atraumatic.  Mouth/Throat: Oropharynx is clear and moist. Mucous  membranes are dry.  Mildly dry mucous membranes  Eyes: Conjunctivae and EOM are normal. Pupils are equal, round, and reactive to light. Right eye exhibits no discharge. Left eye exhibits no discharge.  PERRL, EOMI, no nystagmus, no visual field deficits   Neck: Normal range of motion. Neck supple.  Cardiovascular: Normal rate, regular rhythm, normal heart sounds and intact distal pulses.  Exam reveals no gallop and no friction rub.   No murmur heard. Mildly tachycardic on triage, but HR 95-100 during exam  Pulmonary/Chest: Effort normal and breath sounds normal. No respiratory distress. She has no decreased breath sounds. She has no wheezes. She has no rhonchi. She has no rales.  Abdominal: Soft. Normal appearance and bowel sounds are normal. She exhibits no distension. There is no tenderness. There is no rigidity, no rebound, no guarding, no CVA tenderness, no tenderness at McBurney's point and negative Murphy's sign.  Musculoskeletal: Normal range of motion.  MAE x4 Strength and sensation grossly intact Distal pulses intact Gait steady  Neurological: She is alert and oriented to  person, place, and time. She has normal strength. No sensory deficit.  Skin: Skin is warm, dry and intact. No rash noted.  Psychiatric: She has a normal mood and affect.  Nursing note and vitals reviewed.   ED Course  Procedures (including critical care time) Labs Review Labs Reviewed  URINALYSIS, ROUTINE W REFLEX MICROSCOPIC (NOT AT [facility]) - Abnormal; Notable for the following:    Specific Gravity, Urine 1.035 (*)    Glucose, UA >1000 (*)    Ketones, ur 40 (*)    All other components within normal limits  CBC WITH DIFFERENTIAL/PLATELET - Abnormal; Notable for the following:    Hemoglobin 11.5 (*)    All other components within normal limits  BASIC METABOLIC PANEL - Abnormal; Notable for the following:    Glucose, Bld 324 (*)    All other components within normal limits  URINE MICROSCOPIC-ADD ON -  Abnormal; Notable for the following:    Squamous Epithelial / LPF 0-5 (*)    All other components within normal limits  CBG MONITORING, ED - Abnormal; Notable for the following:    Glucose-Capillary 284 (*)    All other components within normal limits  I-STAT VENOUS BLOOD GAS, ED - Abnormal; Notable for the following:    pH, Ven 7.427 (*)    pCO2, Ven 36.3 (*)    pO2, Ven 51.0 (*)    All other components within normal limits  BLOOD GAS, VENOUS    Imaging Review No results found. I have personally reviewed and evaluated these images and lab results as part of my medical decision-making.   EKG Interpretation None      MDM   Final diagnoses:  Hyperglycemia  Type 2 diabetes mellitus without complication, without long-term current use of insulin (HCC)  Paresthesias  Other polyneuropathy (HCC)  Polydipsia  Polyuria  Polyphagia  Nausea    49 y.o. female here with multiple symptoms related to her diabetes. She came off her DM2 meds 6 months ago because she lost her insurance and can't afford them. Sugars run 280s in the AM, 450-600s after meals. Having polyphagia, polyuria, polydipsia, occasional blurred vision, and nausea. No focal exam findings aside from mildly dry mucous membranes. Will get basic labs to check for DKA/HHS, although doubt this. Will consult care manager to discuss options to get pt back on DM2 regimen. Will give fluids and reassess shortly.   9:40 AM Tachycardia resolving after fluids. VBG without acidosis/alkalosis. CBC w/diff unremarkable. BMP with Gluc 324 but no anion gap or changes in bicarb, therefore doubt DKA/HHS. U/A with glucosuria and slight ketones but likely from dehydration given lab findings not concerning for DKA/HHS. U/A without evidence of UTI. Pt feeling improved after fluids. Case manager by to see pt, [facility], and instructed pt to fill rx's at [facility] pharmacy. Discussed diet and exercise changes to help with  her blood sugar. Refilled meds, discussed that metformin may cause GI upset and she can take half-strength dose for first week and build up to full dose next week to avoid GI upset. Pt agrees to plan. I explained the diagnosis and have given explicit precautions to return to the ER including for any other new or worsening symptoms. The patient understands and accepts the medical plan as it's been dictated and I have answered their questions. Discharge instructions concerning home care and prescriptions have been given. The patient is STABLE and is discharged to home in good condition.  BP 121/90  mmHg  Pulse 82  Temp(Src) 98.9 F (37.2 C) (Oral)  Resp 18  SpO2 99%  LMP 10/24/2015  Meds ordered this encounter  Medications  . sodium chloride 0.9 % bolus 1,000 mL    Sig:   . metFORMIN (GLUCOPHAGE) 1000 MG tablet    Sig: Take 1 tablet (1,000 mg total) by mouth 2 (two) times daily.    Dispense:  60 tablet    Refill:  0    Order Specific Question:  Supervising Provider    Answer:  MILLER, BRIAN [[id number]]  . canagliflozin (INVOKANA) 300 MG TABS tablet    Sig: Take 300 mg by mouth daily before breakfast.    Dispense:  30 tablet    Refill:  0    Order Specific Question:  Supervising Provider    Answer:  Angus Seller Camprubi-Soms, PA-C 11/07/15 1610  Rolland Porter, MD 11/07/15 [phone]

## 2015-11-07 NOTE — Discharge Instructions (Signed)
Your symptoms are due to your diabetes. You should restart your diabetes medications, keeping in mind that metformin may make you have some GI upset and diarrhea so you can start taking half dose twice daily for the first week and then increase to the full dose. Fill these medications at the [facility] as instructed by the case manager. Stay well hydrated with plenty of water. Eat low carb meals. Get plenty of exercise. Follow up with the [facility] at your appointment next month to establish care and for ongoing management of your conditions. Return to the ER for changes or worsening symptoms.   Type 2 Diabetes Mellitus, Adult Type 2 diabetes mellitus, often simply referred to as type 2 diabetes, is a long-lasting (chronic) disease. In type 2 diabetes, the pancreas does not make enough insulin (a hormone), the cells are less responsive to the insulin that is made (insulin resistance), or both. Normally, insulin moves sugars from food into the tissue cells. The tissue cells use the sugars for energy. The lack of insulin or the lack of normal response to insulin causes excess sugars to build up in the blood instead of going into the tissue cells. As a result, high blood sugar (hyperglycemia) develops. The effect of high sugar (glucose) levels can cause many complications. Type 2 diabetes was also previously called adult-onset diabetes, but it can occur at any age.  RISK FACTORS  A person is predisposed to developing type 2 diabetes if someone in the family has the disease and also has one or more of the following primary risk factors:  Weight gain, or being overweight or obese.  An inactive lifestyle.  A history of consistently eating high-calorie foods. Maintaining a normal weight and regular physical activity can reduce the chance of developing type 2 diabetes. SYMPTOMS  A person with type 2 diabetes may not show symptoms initially. The symptoms of type 2  diabetes appear slowly. The symptoms include:  Increased thirst (polydipsia).  Increased urination (polyuria).  Increased urination during the night (nocturia).  Sudden or unexplained weight changes.  Frequent, recurring infections.  Tiredness (fatigue).  Weakness.  Vision changes, such as blurred vision.  Fruity smell to your breath.  Abdominal pain.  Nausea or vomiting.  Cuts or bruises which are slow to heal.  Tingling or numbness in the hands or feet.  An open skin wound (ulcer). DIAGNOSIS Type 2 diabetes is frequently not diagnosed until complications of diabetes are present. Type 2 diabetes is diagnosed when symptoms or complications are present and when blood glucose levels are increased. Your blood glucose level may be checked by one or more of the following blood tests:  A fasting blood glucose test. You will not be allowed to eat for at least 8 hours before a blood sample is taken.  A random blood glucose test. Your blood glucose is checked at any time of the day regardless of when you ate.  A hemoglobin A1c blood glucose test. A hemoglobin A1c test provides information about blood glucose control over the previous 3 months.  An oral glucose tolerance test (OGTT). Your blood glucose is measured after you have not eaten (fasted) for 2 hours and then after you drink a glucose-containing beverage. TREATMENT   You may need to take insulin or diabetes medicine daily to keep blood glucose levels in the desired range.  If you use insulin, you may need to adjust the dosage depending on the carbohydrates that you eat with each  meal or snack.  Lifestyle changes are recommended as part of your treatment. These may include:  Following an individualized diet plan developed by a nutritionist or dietitian.  Exercising daily. Your health care providers will set individualized treatment goals for you based on your age, your medicines, how long you have had diabetes, and any  other medical conditions you have. Generally, the goal of treatment is to maintain the following blood glucose levels:  Before meals (preprandial): 80-130 mg/dL.  After meals (postprandial): below 180 mg/dL.  A1c: less than 6.5-7%. HOME CARE INSTRUCTIONS   Have your hemoglobin A1c level checked twice a year.  Perform daily blood glucose monitoring as directed by your health care provider.  Monitor urine ketones when you are ill and as directed by your health care provider.  Take your diabetes medicine or insulin as directed by your health care provider to maintain your blood glucose levels in the desired range.  Never run out of diabetes medicine or insulin. It is needed every day.  If you are using insulin, you may need to adjust the amount of insulin given based on your intake of carbohydrates. Carbohydrates can raise blood glucose levels but need to be included in your diet. Carbohydrates provide vitamins, minerals, and fiber which are an essential part of a healthy diet. Carbohydrates are found in fruits, vegetables, whole grains, dairy products, legumes, and foods containing added sugars.  Eat healthy foods. You should make an appointment to see a registered dietitian to help you create an eating plan that is right for you.  Lose weight if you are overweight.  Carry a medical alert card or wear your medical alert jewelry.  Carry a 15-gram carbohydrate snack with you at all times to treat low blood glucose (hypoglycemia). Some examples of 15-gram carbohydrate snacks include:  Glucose tablets, 3 or 4.  Glucose gel, 15-gram tube.  Raisins, 2 tablespoons (24 grams).  Jelly beans, 6.  Animal crackers, 8.  Regular pop, 4 ounces (120 mL).  Gummy treats, 9.  Recognize hypoglycemia. Hypoglycemia occurs with blood glucose levels of 70 mg/dL and below. The risk for hypoglycemia increases when fasting or skipping meals, during or after intense exercise, and during sleep.  Hypoglycemia symptoms can include:  Tremors or shakes.  Decreased ability to concentrate.  Sweating.  Increased heart rate.  Headache.  Dry mouth.  Hunger.  Irritability.  Anxiety.  Restless sleep.  Altered speech or coordination.  Confusion.  Treat hypoglycemia promptly. If you are alert and able to safely swallow, follow the 15:15 rule:  Take 15-20 grams of rapid-acting glucose or carbohydrate. Rapid-acting options include glucose gel, glucose tablets, or 4 ounces (120 mL) of fruit juice, regular soda, or low-fat milk.  Check your blood glucose level 15 minutes after taking the glucose.  Take 15-20 grams more of glucose if the repeat blood glucose level is still 70 mg/dL or below.  Eat a meal or snack within 1 hour once blood glucose levels return to normal.  Be alert to feeling very thirsty and urinating more frequently than usual, which are early signs of hyperglycemia. An early awareness of hyperglycemia allows for prompt treatment. Treat hyperglycemia as directed by your health care provider.  Engage in at least 150 minutes of moderate-intensity physical activity a week, spread over at least 3 days of the week or as directed by your health care provider. In addition, you should engage in resistance exercise at least 2 times a week or as directed by your health care  provider. Try to spend no more than 90 minutes at one time inactive.  Adjust your medicine and food intake as needed if you start a new exercise or sport.  Follow your sick-day plan anytime you are unable to eat or drink as usual.  Do not use any tobacco products including cigarettes, chewing tobacco, or electronic cigarettes. If you need help quitting, ask your health care provider.  Limit alcohol intake to no more than 1 drink per day for nonpregnant women and 2 drinks per day for men. You should drink alcohol only when you are also eating food. Talk with your health care provider whether alcohol is  safe for you. Tell your health care provider if you drink alcohol several times a week.  Keep all follow-up visits as directed by your health care provider. This is important.  Schedule an eye exam soon after the diagnosis of type 2 diabetes and then annually.  Perform daily skin and foot care. Examine your skin and feet daily for cuts, bruises, redness, nail problems, bleeding, blisters, or sores. A foot exam by a health care provider should be done annually.  Brush your teeth and gums at least twice a day and floss at least once a day. Follow up with your dentist regularly.  Share your diabetes management plan with your workplace or school.  Keep your immunizations up to date. It is recommended that you receive a flu (influenza) vaccine every year. It is also recommended that you receive a pneumonia (pneumococcal) vaccine. If you are 29 years of age or older and have never received a pneumonia vaccine, this vaccine may be given as a series of two separate shots. Ask your health care provider which additional vaccines may be recommended.  Learn to manage stress.  Obtain ongoing diabetes education and support as needed.  Participate in or seek rehabilitation as needed to maintain or improve independence and quality of life. Request a physical or occupational therapy referral if you are having foot or hand numbness, or difficulties with grooming, dressing, eating, or physical activity. SEEK MEDICAL CARE IF:   You are unable to eat food or drink fluids for more than 6 hours.  You have nausea and vomiting for more than 6 hours.  Your blood glucose level is over 240 mg/dL.  There is a change in mental status.  You develop an additional serious illness.  You have diarrhea for more than 6 hours.  You have been sick or have had a fever for a couple of days and are not getting better.  You have pain during any physical activity.  SEEK IMMEDIATE MEDICAL CARE IF:  You have difficulty  breathing.  You have moderate to large ketone levels.   This information is not intended to replace advice given to you by your health care provider. Make sure you discuss any questions you have with your health care provider.   Document Released: 11/09/2005 Document Revised: 07/31/2015 Document Reviewed: 06/07/2012 Elsevier Interactive Patient Education 2016 [employer]   Hyperglycemia High blood sugar (hyperglycemia) means that the level of sugar in your blood is higher than it should be. Signs of high blood sugar include:  Feeling thirsty.  Frequent peeing (urinating).  Feeling tired or sleepy.  Dry mouth.  Vision changes.  Feeling weak.  Feeling hungry but losing weight.  Numbness and tingling in your hands or feet.  Headache. When you ignore these signs, your blood sugar may keep going up. These problems may get worse, and other problems  may begin. HOME CARE  Check your blood sugars as told by your doctor. Write down the numbers with the date and time.  Take the right amount of insulin or diabetes pills at the right time. Write down the dose with date and time.  Refill your insulin or diabetes pills before running out.  Watch what you eat. Follow your meal plan.  Drink liquids without sugar, such as water. Check with your doctor if you have kidney or heart disease.  Follow your doctor's orders for exercise. Exercise at the same time of day.  Keep your doctor's appointments. GET HELP RIGHT AWAY IF:   You have trouble thinking or are confused.  You have fast breathing with fruity smelling breath.  You pass out (faint).  You have 2 to 3 days of high blood sugars and you do not know why.  You have chest pain.  You are feeling sick to your stomach (nauseous) or throwing up (vomiting).  You have sudden vision changes. MAKE SURE YOU:   Understand these instructions.  Will watch your condition.  Will get help right away if you are not doing well or  get worse.   This information is not intended to replace advice given to you by your health care provider. Make sure you discuss any questions you have with your health care provider.   Document Released: 09/06/2009 Document Revised: 11/30/2014 Document Reviewed: 07/16/2015 Elsevier Interactive Patient Education 2016 [employer]  How to Avoid Diabetes Problems You can do a lot to prevent or slow down diabetes problems. Following your diabetes plan and taking care of yourself can reduce your risk of serious or life-threatening complications. Below, you will find certain things you can do to prevent diabetes problems. MANAGE YOUR DIABETES Follow your health care provider's, nurse educator's, and dietitian's instructions for managing your diabetes. They will teach you the basics of diabetes care. They can help answer questions you may have. Learn about diabetes and make healthy choices regarding eating and physical activity. Monitor your blood glucose level regularly. Your health care provider will help you decide how often to check your blood glucose level depending on your treatment goals and how well you are meeting them.  DO NOT USE NICOTINE Nicotine and diabetes are a dangerous combination. Nicotine raises your risk for diabetes problems. If you quit using nicotine, you will lower your risk for heart attack, stroke, nerve disease, and kidney disease. Your cholesterol and your blood pressure levels may improve. Your blood circulation will also improve. Do not use any tobacco products, including cigarettes, chewing tobacco, or electronic cigarettes. If you need help quitting, ask your health care provider. KEEP YOUR BLOOD PRESSURE UNDER CONTROL Your health care provider will determine your individualized target blood pressure based on your age, your medicines, how long you have had diabetes, and any other medical conditions you have. Blood pressure consists of two numbers. Generally, the goal is to  keep your top number (systolic pressure) at or below 130, and your bottom number (diastolic pressure) at or below 80. Your health care provider may recommend a lower target blood pressure reading, if appropriate. Meal planning, medicines, and exercise can help you reach your target blood pressure. Make sure your health care provider checks your blood pressure at every visit. KEEP YOUR CHOLESTEROL UNDER CONTROL Normal cholesterol levels will help prevent heart disease and stroke. These are the biggest health problems for people with diabetes. Keeping cholesterol levels under control can also help with blood flow. Have your  cholesterol level checked at least once a year. Your health care provider may prescribe a medicine known as a statin. Statins lower your cholesterol. If you are not taking a statin, ask your health care provider if you should be. Meal planning, exercise, and medicines can help you reach your cholesterol targets.  SCHEDULE AND KEEP YOUR ANNUAL PHYSICAL EXAMS AND EYE EXAMS Your health care provider will tell you how often he or she wants to see you depending on your plan of treatment. It is important that you keep these appointments so that possible problems can be identified early and complications can be avoided or treated.  Every visit with your health care provider should include your weight, blood pressure, and an evaluation of your blood glucose control.  Your hemoglobin A1c should be checked:  At least twice a year if you are at your goal.  Every 3 months if there are changes in treatment.  If you are not meeting your goals.  Your blood lipids should be checked yearly. You should also be checked yearly to see if you have protein in your urine (microalbumin).  Schedule a dilated eye exam within 5 years of your diagnosis if you have type 1 diabetes, and then yearly. Schedule a dilated eye exam at diagnosis if you have type 2 diabetes, and then yearly. All exams thereafter can  be extended to every 2 to 3 years if one or more exams have been normal. KEEP YOUR VACCINES CURRENT It is recommended that you receive a flu (influenza) vaccine every year. It is also recommended that you receive a pneumonia (pneumococcal) vaccine. If you are 62 years of age or older and have never received a pneumonia vaccine, this vaccine may be given as a series of two separate shots. Ask your health care provider which additional vaccines may be recommended. TAKE CARE OF YOUR FEET  Diabetes may cause you to have a poor blood supply (circulation) to your legs and feet. Because of this, the skin may be thinner, break easier, and heal more slowly. You also may have nerve damage in your legs and feet, causing decreased feeling. You may not notice minor injuries to your feet that could lead to serious problems or infections. Taking care of your feet is very important. Visual foot exams are performed at every routine medical visit. The exams check for cuts, injuries, or other problems with the feet. A comprehensive foot exam should be done yearly. This includes visual inspection as well as assessing foot pulses and testing for loss of sensation. You should also do the following:  Inspect your feet daily for cuts, calluses, blisters, ingrown toenails, and signs of infection, such as redness, swelling, or pus.  Wash and dry your feet thoroughly, especially between the toes.  Avoid soaking your feet regularly in hot water baths.  Moisturize dry skin with lotion, avoiding areas between your toes.  Cut toenails straight across and file the edges.  Avoid shoes that do not fit well or have areas that irritate your skin.  Avoid going barefooted or wearing only socks. Your feet need protection. TAKE CARE OF YOUR TEETH People with poorly controlled diabetes are more likely to have gum (periodontal) disease. These infections make diabetes harder to control. Periodontal diseases, if left untreated, can lead  to tooth loss. Brush your teeth twice a day, floss, and see your dentist for checkups and cleaning every 6 months, or 2 times a year. ASK YOUR HEALTH CARE PROVIDER ABOUT TAKING ASPIRIN Taking aspirin  daily is recommended to help prevent cardiovascular disease in people with and without diabetes. Ask your health care provider if this would benefit you and what dose he or she would recommend. DRINK RESPONSIBLY Moderate amounts of alcohol (less than 1 drink per day for adult women and less than 2 drinks per day for adult men) have a minimal effect on blood glucose if ingested with food. It is important to eat food with alcohol to avoid hypoglycemia. People should avoid alcohol if they have a history of alcohol abuse or dependence, if they are pregnant, and if they have liver disease, pancreatitis, advanced neuropathy, or severe hypertriglyceridemia. LESSEN STRESS Living with diabetes can be stressful. When you are under stress, your blood glucose may be affected in two ways:  Stress hormones may cause your blood glucose to rise.  You may be distracted from taking good care of yourself. It is a good idea to be aware of your stress level and make changes that are necessary to help you better manage challenging situations. Support groups, planned relaxation, a hobby you enjoy, meditation, healthy relationships, and exercise all work to lower your stress level. If your efforts do not seem to be helping, get help from your health care provider or a trained mental health professional.   This information is not intended to replace advice given to you by your health care provider. Make sure you discuss any questions you have with your health care provider.   Document Released: 07/28/2011 Document Revised: 11/30/2014 Document Reviewed: 01/03/2014 Elsevier Interactive Patient Education 2016 [employer] Inc[employer]  Peripheral Neuropathy Peripheral neuropathy is a type of nerve damage. It affects nerves that carry  signals between the spinal cord and other parts of the body. These are called peripheral nerves. With peripheral neuropathy, one nerve or a group of nerves may be damaged.  CAUSES  Many things can damage peripheral nerves. For some people with peripheral neuropathy, the cause is unknown. Some causes include:  Diabetes. This is the most common cause of peripheral neuropathy.  Injury to a nerve.  Pressure or stress on a nerve that lasts a long time.  Too little vitamin B. Alcoholism can lead to this.  Infections.  Autoimmune diseases, such as multiple sclerosis and systemic lupus erythematosus.  Inherited nerve diseases.  Some medicines, such as cancer drugs.  Toxic substances, such as lead and mercury.  Too little blood flowing to the legs.  Kidney disease.  Thyroid disease. SIGNS AND SYMPTOMS  Different people have different symptoms. The symptoms you have will depend on which of your nerves is damaged. Common symptoms include:  Loss of feeling (numbness) in the feet and hands.  Tingling in the feet and hands.  Pain that burns.  Very sensitive skin.  Weakness.  Not being able to move a part of the body (paralysis).  Muscle twitching.  Clumsiness or poor coordination.  Loss of balance.  Not being able to control your bladder.  Feeling dizzy.  Sexual problems. DIAGNOSIS  Peripheral neuropathy is a symptom, not a disease. Finding the cause of peripheral neuropathy can be hard. To figure that out, your health care provider will take a medical history and do a physical exam. A neurological exam will also be done. This involves checking things affected by your brain, spinal cord, and nerves (nervous system). For example, your health care provider will check your reflexes, how you move, and what you can feel.  Other types of tests may also be ordered, such as:  Blood tests.  A test of the fluid in your spinal cord.  Imaging tests, such as CT scans or an  MRI.  Electromyography (EMG). This test checks the nerves that control muscles.  Nerve conduction velocity tests. These tests check how fast messages pass through your nerves.  Nerve biopsy. A small piece of nerve is removed. It is then checked under a microscope. TREATMENT   Medicine is often used to treat peripheral neuropathy. Medicines may include:  Pain-relieving medicines. Prescription or over-the-counter medicine may be suggested.  Antiseizure medicine. This may be used for pain.  Antidepressants. These also may help ease pain from neuropathy.  Lidocaine. This is a numbing medicine. You might wear a patch or be given a shot.  Mexiletine. This medicine is typically used to help control irregular heart rhythms.  Surgery. Surgery may be needed to relieve pressure on a nerve or to destroy a nerve that is causing pain.  Physical therapy to help movement.  Assistive devices to help movement. HOME CARE INSTRUCTIONS   Only take over-the-counter or prescription medicines as directed by your health care provider. Follow the instructions carefully for any given medicines. Do not take any other medicines without first getting approval from your health care provider.  If you have diabetes, work closely with your health care provider to keep your blood sugar under control.  If you have numbness in your feet:  Check every day for signs of injury or infection. Watch for redness, warmth, and swelling.  Wear padded socks and comfortable shoes. These help protect your feet.  Do not do things that put pressure on your damaged nerve.  Do not smoke. Smoking keeps blood from getting to damaged nerves.  Avoid or limit alcohol. Too much alcohol can cause a lack of B vitamins. These vitamins are needed for healthy nerves.  Develop a good support system. Coping with peripheral neuropathy can be stressful. Talk to a mental health specialist or join a support group if you are  struggling.  Follow up with your health care provider as directed. SEEK MEDICAL CARE IF:   You have new signs or symptoms of peripheral neuropathy.  You are struggling emotionally from dealing with peripheral neuropathy.  You have a fever. SEEK IMMEDIATE MEDICAL CARE IF:   You have an injury or infection that is not healing.  You feel very dizzy or begin vomiting.  You have chest pain.  You have trouble breathing.   This information is not intended to replace advice given to you by your health care provider. Make sure you discuss any questions you have with your health care provider.   Document Released: 10/30/2002 Document Revised: 07/22/2011 Document Reviewed: 07/17/2013 Elsevier Interactive Patient Education 2016 [employer]  Prediabetes Eating Plan Prediabetes--also called impaired glucose tolerance or impaired fasting glucose--is a condition that causes blood sugar (blood glucose) levels to be higher than normal. Following a healthy diet can help to keep prediabetes under control. It can also help to lower the risk of type 2 diabetes and heart disease, which are increased in people who have prediabetes. Along with regular exercise, a healthy diet:  Promotes weight loss.  Helps to control blood sugar levels.  Helps to improve the way that the body uses insulin. WHAT DO I NEED TO KNOW ABOUT THIS EATING PLAN?  Use the glycemic index (GI) to plan your meals. The index tells you how quickly a food will raise your blood sugar. Choose low-GI foods. These foods take a longer time to raise blood sugar.  Pay close attention to the amount of carbohydrates in the food that you eat. Carbohydrates increase blood sugar levels.  Keep track of how many calories you take in. Eating the right amount of calories will help you to achieve a healthy weight. Losing about 7 percent of your starting weight can help to prevent type 2 diabetes.  You may want to follow a Mediterranean diet. This  diet includes a lot of vegetables, lean meats or fish, whole grains, fruits, and healthy oils and fats. WHAT FOODS CAN I EAT? Grains Whole grains, such as whole-wheat or whole-grain breads, crackers, cereals, and pasta. Unsweetened oatmeal. Bulgur. Barley. Quinoa. Brown rice. Corn or whole-wheat flour tortillas or taco shells. Vegetables Lettuce. Spinach. Peas. Beets. Cauliflower. Cabbage. Broccoli. Carrots. Tomatoes. Squash. Eggplant. Herbs. Peppers. Onions. Cucumbers. Brussels sprouts. Fruits Berries. Bananas. Apples. Oranges. Grapes. Papaya. Mango. Pomegranate. Kiwi. Grapefruit. Cherries. Meats and Other Protein Sources Seafood. Lean meats, such as chicken and [place] or lean cuts of pork and beef. Tofu. Eggs. Nuts. Beans. Dairy Low-fat or fat-free dairy products, such as yogurt, cottage cheese, and cheese. Beverages Water. Tea. Coffee. Sugar-free or diet soda. Seltzer water. Milk. Milk alternatives, such as soy or almond milk. Condiments Mustard. Relish. Low-fat, low-sugar ketchup. Low-fat, low-sugar barbecue sauce. Low-fat or fat-free mayonnaise. Sweets and Desserts Sugar-free or low-fat pudding. Sugar-free or low-fat ice cream and other frozen treats. Fats and Oils Avocado. Walnuts. Olive oil. The items listed above may not be a complete list of recommended foods or beverages. Contact your dietitian for more options.  WHAT FOODS ARE NOT RECOMMENDED? Grains Refined white flour and flour products, such as bread, pasta, snack foods, and cereals. Beverages Sweetened drinks, such as sweet iced tea and soda. Sweets and Desserts Baked goods, such as cake, cupcakes, pastries, cookies, and cheesecake. The items listed above may not be a complete list of foods and beverages to avoid. Contact your dietitian for more information.   This information is not intended to replace advice given to you by your health care provider. Make sure you discuss any questions you have with your health care  provider.   Document Released: 03/26/2015 Document Reviewed: 03/26/2015 Elsevier Interactive Patient Education [employer]

## 2015-11-07 NOTE — ED Notes (Signed)
PT requesting yeast infection medicine px.

## 2015-11-07 NOTE — ED Notes (Signed)
PA at bedside.

## 2015-11-07 NOTE — Care Management Note (Signed)
Case Management Note  Patient Details  Name: Janet Ware MRN: [id number] Date of Birth: [birth year]  Subjective/Objective:                  49 y.o. female with a PMHx of HTN, DM2, PCOS, anxiety, asthma, IBS, and chronic SOB, who presents to the ED with complaints of hyperglycemia. Patient states that 6 months ago she lost her insurance and could no longer afford her metformin and invokana. //Home alone (with mother)  Action/Plan: Follow for disposition needs.   Expected Discharge Date:      11/07/15            Expected Discharge Plan:  Home/Self Care  In-House Referral:  PCP / [facility]  Discharge planning Services  CM Consult, Follow-up appt scheduled, [place] Acute [facility] Choice:  NA Choice offered to:  Patient  DME Arranged:  N/A DME Agency:  NA  HH Arranged:  NA HH Agency:  NA  Status of Service:  Completed, signed off  Medicare Important Message Given:    Date Medicare IM Given:    Medicare IM give by:    Date Additional Medicare IM Given:    Additional Medicare Important Message give by:     If discussed at [place] of Stay Meetings, dates discussed:    Additional Comments: [name] J. Clydene Laming, RN, BSN, [place] [phone] ED CM consulted regarding PCP establishment and insurance enrollment. Pt presented to [facility] ED today with hypergylcemia. NCM met with pt at bedside; pt confirms not having access to f/u care with PCP or insurance coverage. Discussed with patient importance and benefits of establishing PCP, and not utilizing the ED for primary care needs. Pt verbalized understanding and is in agreement. Discussed other options, provided list of local  affordable PCPs.  Pt voiced interest in the [facility] and [place].  NCM advised that [facility]  Internal Medicine providers are seeing pts at [facility]. Pt verbalized understanding. NCM set up appointment with Sharon Seller, NP 12/09/15 at 2:00.  NCM informed pt they may visit  [place] and [place] for Rx needs upon discharge.  NCM presented [facility] Eligibility and Enrollment packet to complete prior to scheduled appointment.  NCM instructed pt to call Saintclair Halsted, [place] Specialist with question or concerns pertaining P4[facility]/orange card process.  Pt verbalized understanding.  Fuller Mandril, RN 11/07/2015, 9:16 AM

## 2015-12-09 ENCOUNTER — Ambulatory Visit: Payer: Self-pay | Admitting: Family Medicine

## 2016-01-02 ENCOUNTER — Ambulatory Visit: Payer: Self-pay | Admitting: Family Medicine

## 2017-01-12 ENCOUNTER — Ambulatory Visit (INDEPENDENT_AMBULATORY_CARE_PROVIDER_SITE_OTHER): Payer: BLUE CROSS/BLUE SHIELD | Admitting: Physician Assistant

## 2017-01-12 VITALS — BP 126/88 | HR 88 | Temp 98.5°F | Resp 18 | Ht 67.5 in | Wt 167.0 lb

## 2017-01-12 DIAGNOSIS — R41840 Attention and concentration deficit: Secondary | ICD-10-CM | POA: Diagnosis not present

## 2017-01-12 DIAGNOSIS — Z1329 Encounter for screening for other suspected endocrine disorder: Secondary | ICD-10-CM

## 2017-01-12 DIAGNOSIS — Z130 Encounter for screening for diseases of the blood and blood-forming organs and certain disorders involving the immune mechanism: Secondary | ICD-10-CM

## 2017-01-12 DIAGNOSIS — E119 Type 2 diabetes mellitus without complications: Secondary | ICD-10-CM | POA: Diagnosis not present

## 2017-01-12 DIAGNOSIS — Z113 Encounter for screening for infections with a predominantly sexual mode of transmission: Secondary | ICD-10-CM | POA: Diagnosis not present

## 2017-01-12 DIAGNOSIS — J45909 Unspecified asthma, uncomplicated: Secondary | ICD-10-CM

## 2017-01-12 DIAGNOSIS — Z0000 Encounter for general adult medical examination without abnormal findings: Secondary | ICD-10-CM | POA: Diagnosis not present

## 2017-01-12 DIAGNOSIS — Z13220 Encounter for screening for lipoid disorders: Secondary | ICD-10-CM | POA: Diagnosis not present

## 2017-01-12 LAB — POCT URINALYSIS DIP (MANUAL ENTRY)
BILIRUBIN UA: NEGATIVE
Blood, UA: NEGATIVE
Glucose, UA: >=1000 — AB
LEUKOCYTES UA: NEGATIVE
Nitrite, UA: NEGATIVE
PROTEIN UA: NEGATIVE
SPEC GRAV UA: 1.025
Urobilinogen, UA: 0.2
pH, UA: 5.5

## 2017-01-12 LAB — GLUCOSE, POCT (MANUAL RESULT ENTRY): POC Glucose: 213 mg/dl — AB (ref 70–99)

## 2017-01-12 LAB — POC MICROSCOPIC URINALYSIS (UMFC): Mucus: ABSENT

## 2017-01-12 LAB — POCT GLYCOSYLATED HEMOGLOBIN (HGB A1C): Hemoglobin A1C: 11.6

## 2017-01-12 MED ORDER — METFORMIN HCL 1000 MG PO TABS: 1000.0000 mg | ORAL_TABLET | Freq: Two times a day (BID) | ORAL | 0 refills | Status: DC

## 2017-01-12 MED ORDER — BLOOD GLUCOSE MONITORING SUPPL W/DEVICE KIT: 1.0000 [IU] | PACK | Freq: Every day | 0 refills | Status: AC

## 2017-01-12 MED ORDER — LISINOPRIL 2.5 MG PO TABS: 2.5000 mg | ORAL_TABLET | Freq: Every day | ORAL | 1 refills | Status: DC

## 2017-01-12 MED ORDER — BLOOD GLUCOSE MONITORING SUPPL W/DEVICE KIT: 1.0000 [IU] | PACK | 0 refills | Status: AC | PRN

## 2017-01-12 MED ORDER — ALBUTEROL SULFATE 108 (90 BASE) MCG/ACT IN AEPB: 1.0000 | INHALATION_SPRAY | RESPIRATORY_TRACT | 0 refills | Status: DC | PRN

## 2017-01-12 MED ORDER — CANAGLIFLOZIN 100 MG PO TABS: 100.0000 mg | ORAL_TABLET | Freq: Every day | ORAL | 0 refills | Status: DC

## 2017-01-12 MED ORDER — CANAGLIFLOZIN 300 MG PO TABS: 300.0000 mg | ORAL_TABLET | Freq: Every day | ORAL | 0 refills | Status: DC

## 2017-01-12 NOTE — Progress Notes (Signed)
Janet Ware  MRN: [id number] DOB: 07/23/1966  Subjective:  Pt is a 51 y.o. female who presents for annual physical exam. Pt is fasting today.   Diet: She likes all foods. She tries to eat more chicken and vegetables. She tries to limit sweets since she has not been on medication. She drinks mostly water and coke zero.  Exercise: She is exercising by walking 3 times a week.   Social: Pt works at a [employer]. Pt is not currently sexual activity. Pt is not married and has no children.   Menstrual cycles: She has hx of PCOS. Her cycles are typically once a month. Notes they are not consistent usually 2-3 weeks apart. Has both menorrhagia and dysmenorrhea with cycles.  Chronic conditions:  1) ADD: Has had a diagnosis from prior PCP since 2010. Had titrated up to a dose of 62m BID but stopped taking it 2 years ago due to not having insurance. She finally has insurance and states her ADD is effecting her job. Notes she cannot focus enough to finish one task which is why she started it in the first place.   2) T2DM: Has had a dx since 2009. Was controlled metformin 10019mBID and invokana 30069maily. However she has been off of these for one year due to not having insurance. She does not check her blood sugars because she knows it will stress her out. Has polyuria, paraesthesia in bilateral feet, and nausea. Denies foot ulcer, polyphagia, dry mouth, dizziness, vomiting, and abdominal pain. She was followed closely by endocrinologist which seems to be the only way she can control her diabetes.   3) PCOS: Dx made in 2009. Was controlled on metformin. Been out of medication for one year. Does not have a current gynecologist. Had tubal ligation in 2007.   4) GERD: Used to take prilosec daily but notes it has basically resolved with lifestyle modifications.   5) Anxiety: Used to take zoloft and lorazepam but she states with therapy she got off medication and feels well controlled now with her  relaxation techniques.  6) Asthma: Allergy and exercise induced. Now that her cat is dead, she has no issues. She does not use maintenance or rescue inhaler.   Last dental exam: 09/2016 Last vision exam: 2013 Last pap smear: 2016, it was a 6 month follow up after having dysplasia on prior pap, last one was normal Last mammogram: 2016, normal   Vaccinations      Tetanus: Cannot recall when but thinks it was <10 years ago.         Patient Active Problem List   Diagnosis Date Noted  . CHEST PAIN-UNSPECIFIED 08/14/2010  . DIABETES MELLITUS, TYPE II 08/13/2010  . HYPERTENSION 08/13/2010  . ASTHMA 08/13/2010  . IBS 08/13/2010  . HAIR LOSS 08/13/2010  . FATIGUE 08/13/2010  . EDEMA 08/13/2010  . WEIGHT GAIN 08/13/2010  . PALPITATIONS 08/13/2010  . DYSPNEA 08/13/2010  . WHEEZING 08/13/2010  . DIARRHEA 08/13/2010  . PELVIC  PAIN 08/13/2010    Current Outpatient Prescriptions on File Prior to Visit  Medication Sig Dispense Refill  . CINNAMON PO Take 30 drops by mouth daily.    . Coconut Oil 1000 MG CAPS Take 1 capsule by mouth daily.    . Chromium Picolinate (CHROMIUM PICOLATE PO) Take 1 tablet by mouth daily.     No current facility-administered medications on file prior to visit.     No Known Allergies  Social History   Social History  .  Marital status: Divorced    Spouse name: N/A  . Number of children: N/A  . Years of education: N/A   Social History Main Topics  . Smoking status: Never Smoker  . Smokeless tobacco: Never Used  . Alcohol use 9.0 oz/week    15 Glasses of wine per week  . Drug use: No  . Sexual activity: Yes    Birth control/ protection: Surgical   Other Topics Concern  . None   Social History Narrative  . None    Past Surgical History:  Procedure Laterality Date  . DILATION AND CURETTAGE OF UTERUS     x 2  . RHINOPLASTY    . TUBAL LIGATION    . WISDOM TOOTH EXTRACTION      No family history on file.  Review of Systems    Constitutional: Negative for activity change, appetite change, chills, diaphoresis, fatigue, fever and unexpected weight change.  HENT: Negative for congestion, dental problem, drooling, ear discharge, ear pain, facial swelling, hearing loss, mouth sores, nosebleeds, postnasal drip, rhinorrhea, sinus pain, sinus pressure, sneezing, sore throat, tinnitus, trouble swallowing and voice change.   Eyes: Negative for photophobia, pain, discharge, redness, itching and visual disturbance.  Respiratory: Negative for apnea, cough, choking, chest tightness, shortness of breath, wheezing and stridor.   Cardiovascular: Negative for chest pain, palpitations and leg swelling.  Gastrointestinal: Negative for abdominal distention, abdominal pain, anal bleeding, blood in stool, constipation, diarrhea, nausea, rectal pain and vomiting.  Endocrine: Negative for cold intolerance, heat intolerance, polydipsia, polyphagia and polyuria.  Genitourinary: Negative for decreased urine volume, difficulty urinating, dyspareunia, dysuria, enuresis, flank pain, frequency, genital sores, hematuria, menstrual problem, pelvic pain, urgency, vaginal bleeding, vaginal discharge and vaginal pain.  Musculoskeletal: Negative for arthralgias, back pain, gait problem, joint swelling, myalgias, neck pain and neck stiffness.  Skin: Negative for color change, pallor, rash and wound.  Allergic/Immunologic: Negative for environmental allergies, food allergies and immunocompromised state.  Neurological: Negative for dizziness, tremors, seizures, syncope, facial asymmetry, speech difficulty, weakness, light-headedness, numbness and headaches.  Hematological: Negative for adenopathy. Does not bruise/bleed easily.  Psychiatric/Behavioral: Positive for decreased concentration and sleep disturbance. Negative for agitation, behavioral problems, confusion, dysphoric mood, hallucinations, self-injury and suicidal ideas. The patient is not nervous/anxious  and is not hyperactive.     Objective:  BP 126/88 (BP Location: Right Arm, Patient Position: Sitting, Cuff Size: Small)   Pulse 88   Temp 98.5 F (36.9 C) (Oral)   Resp 18   Ht 5' 7.5" (1.715 m)   Wt 167 lb (75.8 kg)   LMP 01/04/2017   SpO2 96%   BMI 25.77 kg/m   Physical Exam  Constitutional: She is oriented to person, place, and time and well-developed, well-nourished, and in no distress.  HENT:  Head: Normocephalic and atraumatic.  Right Ear: Hearing, tympanic membrane, external ear and ear canal normal.  Left Ear: Hearing, tympanic membrane, external ear and ear canal normal.  Nose: Nose normal.  Mouth/Throat: Uvula is midline, oropharynx is clear and moist and mucous membranes are normal. No oropharyngeal exudate.  Eyes: Conjunctivae, EOM and lids are normal. Pupils are equal, round, and reactive to light. No scleral icterus.  Neck: Trachea normal and normal range of motion. No thyroid mass and no thyromegaly present.  Cardiovascular: Normal rate, regular rhythm, normal heart sounds and intact distal pulses.   Pulmonary/Chest: Effort normal and breath sounds normal.  Abdominal: Soft. Normal appearance and bowel sounds are normal. There is no tenderness.  Lymphadenopathy:  Head (right side): No tonsillar, no preauricular, no posterior auricular and no occipital adenopathy present.       Head (left side): No tonsillar, no preauricular, no posterior auricular and no occipital adenopathy present.    She has no cervical adenopathy.       Right: No supraclavicular adenopathy present.       Left: No supraclavicular adenopathy present.  Neurological: She is alert and oriented to person, place, and time. She has normal sensation, normal strength and normal reflexes. Gait normal.  Skin: Skin is warm and dry.  Psychiatric: Affect normal.  Very talkative during exam.    Diabetic Foot Form - Detailed   Diabetic Foot Exam - detailed Diabetic Foot exam was performed with the  following findings:  Yes 01/12/2017 11:51 AM  Visual Foot Exam completed.:  Yes  Is there a history of foot ulcer?:  No Can the patient see the bottom of their feet?:  Yes Are the shoes appropriate in style and fit?:  No Is there swelling or and abnormal foot shape?:  No Are the toenails long?:  No Are the toenails thick?:  No Do you have pain in calf while walking?:  Yes (Comment: occasionally) Is there a claw toe deformity?:  No Is there elevated skin temparature?:  No Is there limited skin dorsiflexion?:  No Is there foot or ankle muscle weakness?:  Yes (Comment: right ankle) Are the toenails ingrown?:  No Normal Range of Motion:  Yes Pulse Foot Exam completed.:  Yes  Sensory Foot Exam Completed.:  Yes Swelling:  No Semmes-Weinstein Monofilament Test R Foot Test Control:  Pos L Foot Test Control:  Pos  R Site 1-Great Toe:  Pos L Site 1-Great Toe:  Pos  R Site 4:  Pos L Site 4:  Pos  R Site 5:  Pos L Site 5:  Pos         Visual Acuity Screening   Right eye Left eye Both eyes  Without correction: 20/30 20/20 20/15   With correction:      Results for orders placed or performed in visit on 01/12/17 (from the past 24 hour(s))  POCT urinalysis dipstick     Status: Abnormal   Collection Time: 01/12/17 11:52 AM  Result Value Ref Range   Color, UA yellow yellow   Clarity, UA clear clear   Glucose, UA >=1,000 (A) negative   Bilirubin, UA negative negative   Ketones, POC UA small (15) (A) negative   Spec Grav, UA 1.025    Blood, UA negative negative   pH, UA 5.5    Protein Ur, POC negative negative   Urobilinogen, UA 0.2    Nitrite, UA Negative Negative   Leukocytes, UA Negative Negative  POCT glycosylated hemoglobin (Hb A1C)     Status: None   Collection Time: 01/12/17 12:02 PM  Result Value Ref Range   Hemoglobin A1C 11.6   POCT Microscopic Urinalysis ([facility])     Status: Abnormal   Collection Time: 01/12/17 12:03 PM  Result Value Ref Range   WBC,UR,HPF,POC None None  WBC/hpf   RBC,UR,HPF,POC None None RBC/hpf   Bacteria None None, Too numerous to count   Mucus Absent Absent   Epithelial Cells, UR Per Microscopy Moderate (A) None, Too numerous to count cells/hpf  POCT glucose (manual entry)     Status: Abnormal   Collection Time: 01/12/17  1:02 PM  Result Value Ref Range   POC Glucose 213 (A) 70 - 99 mg/dl  Assessment and Plan :  Discussed healthy lifestyle, diet, exercise, preventative care, vaccinations, and addressed patient's concerns. Plan for follow up in 3 months. Otherwise, plan for specific conditions below.  1. Annual physical exam Await lab results  2. Type 2 diabetes mellitus without complication, without long-term current use of insulin (HCC) Uncontrolled but POC glucose is reassuring. Considering patient has not been on medication for one year, will restart metformin and invokana. Follow up in 3 months for repeat A1C and microalbumin.  - CMP14+EGFR - POCT glycosylated hemoglobin (Hb A1C) - POCT Microscopic Urinalysis ([facility]) - POCT urinalysis dipstick - HM Diabetes Foot Exam - POCT glucose (manual entry) - Ambulatory referral to Endocrinology - Blood Glucose Monitoring Suppl w/Device KIT; 1 Units by Does not apply route daily.  Dispense: 1 each; Refill: 0 - metFORMIN (GLUCOPHAGE) 1000 MG tablet; Take 1 tablet (1,000 mg total) by mouth 2 (two) times daily.  Dispense: 180 tablet; Refill: 0 - lisinopril (ZESTRIL) 2.5 MG tablet; Take 1 tablet (2.5 mg total) by mouth daily.  Dispense: 90 tablet; Refill: 1 - canagliflozin (INVOKANA) 100 MG TABS tablet; Take 1 tablet (100 mg total) by mouth daily before breakfast.  Dispense: 90 tablet; Refill: 0  3. Screening, lipid Due to dx of T2DM, pt will need a statin initiated. I will prescribe a statin once the lipid panel results so I know which intensity statin to initiate.  - Lipid panel  4. Screening for thyroid disorder - TSH  5. Screening, anemia, deficiency, iron - CBC with  Differential/Platelet  6. Screen for STD (sexually transmitted disease) - GC/Chlamydia Probe Amp - HIV antibody - RPR  7. Attention deficit - Ambulatory referral to Psychiatry  8. Uncomplicated asthma, unspecified asthma severity, unspecified whether persistent - Albuterol Sulfate (PROAIR RESPICLICK) 643 (90 Base) MCG/ACT AEPB; Inhale 1-2 puffs into the lungs as needed.  Dispense: 1 each; Refill: 0  Tenna Delaine PA-C  [facility] 01/12/2017 1:16 PM

## 2017-01-12 NOTE — Patient Instructions (Addendum)
Follow up in 3 months for recheck of A1C. If any of your symptoms worsen, seek care immediately at the ER.   I will contact you in a few days with your lab results.   Thank you for letting me participate in your health and well being.     IF you received an x-ray today, you will receive an invoice from [facility]. Please contact [facility] at [phone] with questions or concerns regarding your invoice.   IF you received labwork today, you will receive an invoice from [facility]. Please contact [facility] at [phone] with questions or concerns regarding your invoice.   Our billing staff will not be able to assist you with questions regarding bills from these companies.  You will be contacted with the lab results as soon as they are available. The fastest way to get your results is to activate your My Chart account. Instructions are located on the last page of this paperwork. If you have not heard from us regarding the results in 2 weeks, please contact this office.

## 2017-01-13 LAB — CBC WITH DIFFERENTIAL/PLATELET
BASOS ABS: 0 10*3/uL (ref 0.0–0.2)
Basos: 1 %
EOS (ABSOLUTE): 0.1 10*3/uL (ref 0.0–0.4)
Eos: 3 %
HEMOGLOBIN: 14.9 g/dL (ref 11.1–15.9)
Hematocrit: 42.7 % (ref 34.0–46.6)
Immature Grans (Abs): 0 10*3/uL (ref 0.0–0.1)
Immature Granulocytes: 0 %
LYMPHS ABS: 1.3 10*3/uL (ref 0.7–3.1)
Lymphs: 27 %
MCH: 30.3 pg (ref 26.6–33.0)
MCHC: 34.9 g/dL (ref 31.5–35.7)
MCV: 87 fL (ref 79–97)
MONOS ABS: 0.3 10*3/uL (ref 0.1–0.9)
Monocytes: 6 %
NEUTROS ABS: 3.1 10*3/uL (ref 1.4–7.0)
Neutrophils: 63 %
PLATELETS: 387 10*3/uL — AB (ref 150–379)
RBC: 4.92 x10E6/uL (ref 3.77–5.28)
RDW: 14.8 % (ref 12.3–15.4)
WBC: 4.8 10*3/uL (ref 3.4–10.8)

## 2017-01-13 LAB — CMP14+EGFR
A/G RATIO: 1.5 (ref 1.2–2.2)
ALBUMIN: 4.5 g/dL (ref 3.5–5.5)
ALT: 15 IU/L (ref 0–32)
AST: 12 IU/L (ref 0–40)
Alkaline Phosphatase: 104 IU/L (ref 39–117)
BILIRUBIN TOTAL: 0.3 mg/dL (ref 0.0–1.2)
BUN / CREAT RATIO: 16 (ref 9–23)
BUN: 6 mg/dL (ref 6–24)
CHLORIDE: 95 mmol/L — AB (ref 96–106)
CO2: 22 mmol/L (ref 18–29)
Calcium: 9.9 mg/dL (ref 8.7–10.2)
Creatinine, Ser: 0.38 mg/dL — ABNORMAL LOW (ref 0.57–1.00)
GFR calc non Af Amer: 126 (ref >59)
GFR, EST AFRICAN AMERICAN: 145 (ref >59)
Globulin, Total: 3 (ref 1.5–4.5)
Glucose: 280 mg/dL — ABNORMAL HIGH (ref 65–99)
POTASSIUM: 4.8 mmol/L (ref 3.5–5.2)
SODIUM: 137 mmol/L (ref 134–144)
TOTAL PROTEIN: 7.5 g/dL (ref 6.0–8.5)

## 2017-01-13 LAB — LIPID PANEL
CHOLESTEROL TOTAL: 198 mg/dL (ref 100–199)
Chol/HDL Ratio: 3.8 (ref 0.0–4.4)
HDL: 52 mg/dL (ref >39)
LDL CALC: 113 — AB (ref 0–99)
Triglycerides: 164 mg/dL — ABNORMAL HIGH (ref 0–149)
VLDL Cholesterol Cal: 33 (ref 5–40)

## 2017-01-13 LAB — GC/CHLAMYDIA PROBE AMP
CHLAMYDIA, DNA PROBE: NEGATIVE
NEISSERIA GONORRHOEAE BY PCR: NEGATIVE

## 2017-01-13 LAB — TSH: TSH: 0.97 u[IU]/mL (ref 0.450–4.500)

## 2017-01-14 LAB — RPR: RPR Ser Ql: NONREACTIVE

## 2017-01-14 LAB — HIV ANTIBODY (ROUTINE TESTING W REFLEX): HIV Screen 4th Generation wRfx: NONREACTIVE

## 2017-01-15 ENCOUNTER — Other Ambulatory Visit: Payer: Self-pay | Admitting: Physician Assistant

## 2017-01-15 MED ORDER — ATORVASTATIN CALCIUM 10 MG PO TABS: 10.0000 mg | ORAL_TABLET | Freq: Every day | ORAL | 3 refills | Status: DC

## 2017-01-27 ENCOUNTER — Encounter: Payer: Self-pay | Admitting: Endocrinology

## 2017-05-16 ENCOUNTER — Other Ambulatory Visit: Payer: Self-pay | Admitting: Physician Assistant

## 2017-05-16 DIAGNOSIS — E119 Type 2 diabetes mellitus without complications: Secondary | ICD-10-CM

## 2017-05-18 ENCOUNTER — Other Ambulatory Visit: Payer: Self-pay | Admitting: *Deleted

## 2017-05-18 DIAGNOSIS — E119 Type 2 diabetes mellitus without complications: Secondary | ICD-10-CM

## 2017-05-18 MED ORDER — METFORMIN HCL 1000 MG PO TABS: 1000.0000 mg | ORAL_TABLET | Freq: Two times a day (BID) | ORAL | 0 refills | Status: DC

## 2017-05-19 ENCOUNTER — Other Ambulatory Visit: Payer: Self-pay | Admitting: Physician Assistant

## 2017-05-19 ENCOUNTER — Encounter: Payer: Self-pay | Admitting: Physician Assistant

## 2017-05-19 ENCOUNTER — Ambulatory Visit (INDEPENDENT_AMBULATORY_CARE_PROVIDER_SITE_OTHER): Payer: BLUE CROSS/BLUE SHIELD | Admitting: Physician Assistant

## 2017-05-19 VITALS — BP 150/82 | HR 89 | Temp 97.7°F | Resp 18 | Ht 67.6 in | Wt 173.0 lb

## 2017-05-19 DIAGNOSIS — L989 Disorder of the skin and subcutaneous tissue, unspecified: Secondary | ICD-10-CM | POA: Diagnosis not present

## 2017-05-19 DIAGNOSIS — E119 Type 2 diabetes mellitus without complications: Secondary | ICD-10-CM

## 2017-05-19 DIAGNOSIS — Z23 Encounter for immunization: Secondary | ICD-10-CM

## 2017-05-19 LAB — POCT GLYCOSYLATED HEMOGLOBIN (HGB A1C): HEMOGLOBIN A1C: 10.2

## 2017-05-19 MED ORDER — LISINOPRIL 2.5 MG PO TABS: 2.5000 mg | ORAL_TABLET | Freq: Every day | ORAL | 1 refills | Status: DC

## 2017-05-19 MED ORDER — METFORMIN HCL 1000 MG PO TABS: 1000.0000 mg | ORAL_TABLET | Freq: Two times a day (BID) | ORAL | 1 refills | Status: DC

## 2017-05-19 MED ORDER — CANAGLIFLOZIN 100 MG PO TABS: 100.0000 mg | ORAL_TABLET | Freq: Every day | ORAL | 3 refills | Status: DC

## 2017-05-19 NOTE — Progress Notes (Signed)
MRN: [id number]  Subjective:   Janet Ware is a 51 y.o. female who presents for follow up of Type 2 diabetes mellitus.   Diagnosis was made in 2009. She used to be controlled on metformin 1032m BID and invokana 3067mdaily. I saw pt in office for the first time 4 months ago after she had been out of her medications for a while. Her A1C was 11.6. She was restarted on her medications. Invokana was planned to titrate up from 10070mo 300m51mily. She was also started on lisinopril 2.5 and atorvastatin 10mg41mly. Today, pt notes she has only been taking metformin 100mg 81m  She has not been doing the invokana because insurance would not initially cover it. Notes she received something in the mail stating they would cover it now so she wants another refill. She has also not been taking her lisinopril. Has taken atorvastatin as prescribed.  She has been out of her metofrmin for the past week.   Patient is not checking home blood sugars. States she does not have the correct glucometer strips. Current symptoms include intermittent nausea and paresthesia of the feet. Patient denies foot ulcerations, polydipsia, polyuria, vomiting and weight loss. Patient is not checking their feet daily. No foot concerns. Last diabetic eye exam eye exam was two years ago. She does endorse occasional blurred vision in the am.   Diet: She eats a variety of food. Does consume sweets daily. Drinks sweet tea, lemonade, soda, and water.  Does not engage in structured exercise. Denies smoking.   Of note, pt notes the only time she has been at a "normal A1C" was when she was being followed closely by endocrinology. She had to stop seeing her endocrinologist, Dr. Balan,[place] loss of insurance. Now that she has insurance, she would like to start seeing endocrinology again.     Objective:   PHYSICAL EXAM BP (!) 150/82 (BP Location: Right Arm, Patient Position: Sitting, Cuff Size: Normal)   Pulse 89   Temp 97.7 F (36.5 C)  (Oral)   Resp 18   Ht 5' 7.6" (1.717 m)   Wt 173 lb (78.5 kg)   LMP 04/27/2017 (Approximate)   SpO2 97%   BMI 26.62 kg/m   Physical Exam  Constitutional: She is oriented to person, place, and time. She appears well-developed and well-nourished.  HENT:  Head: Normocephalic and atraumatic.  Eyes: Conjunctivae are normal.  Neck: Normal range of motion.  Cardiovascular: Normal rate, regular rhythm, normal heart sounds and intact distal pulses.   Pulmonary/Chest: Effort normal and breath sounds normal. She has no wheezes. She has no rales.  Abdominal: Soft. Normal appearance and bowel sounds are normal. She exhibits no distension. There is no tenderness.  Neurological: She is alert and oriented to person, place, and time.  Skin: Skin is warm and dry.     Psychiatric: She has a normal mood and affect.  Vitals reviewed.     Results for orders placed or performed in visit on 05/19/17 (from the past 24 hour(s))  POCT glycosylated hemoglobin (Hb A1C)     Status: None   Collection Time: 05/19/17  8:42 AM  Result Value Ref Range   Hemoglobin A1C 10.2     Assessment and Plan :  1. Type 2 diabetes mellitus without complication, without long-term current use of insulin (HCC) Uncontrolled. Pt encouraged to make lifestyle modifications such as limiting sweets in diet and beginning daily exercise. Have given refills for medication while she gets set  up with endocrinology. Have contacted pharmacy about blood glucose strips, left a message for them to contact our office. Plan to follow up with endocrinology in 3 months. Return here as needed.  - CMP14+EGFR - POCT glycosylated hemoglobin (Hb A1C) - Microalbumin/Creatinine Ratio, Urine - canagliflozin (INVOKANA) 100 MG TABS tablet; Take 1 tablet (100 mg total) by mouth daily before breakfast.  Dispense: 30 tablet; Refill: 3 - Ambulatory referral to Ophthalmology - Ambulatory referral to Endocrinology - lisinopril (ZESTRIL) 2.5 MG tablet; Take 1  tablet (2.5 mg total) by mouth daily.  Dispense: 90 tablet; Refill: 1 - metFORMIN (GLUCOPHAGE) 1000 MG tablet; Take 1 tablet (1,000 mg total) by mouth 2 (two) times daily.  Dispense: 180 tablet; Refill: 1  2. Skin lesion Incidental suspicious appearing finding on exam, pt unsure at how long the lesion has been present. Asymptomatic. Referral to derm placed.  - Ambulatory referral to Dermatology  3. Need for Td vaccine - Td vaccine greater than or equal to 7yo preservative free IM  Tenna Delaine, PA-C  [facility] [place] 05/19/2017 9:13 AM

## 2017-05-19 NOTE — Patient Instructions (Addendum)
For diabetes management, it is important that you start gaining better control over your sugars. Your A1C is 10.2. I recommend decreasing the amount of sweets in your diet. Continue taking metformin as prescribed. Also start invokana as discussed. I have resent a prescription for lisinopril, which you should take daily as well to help protect your kidneys. This can also help lower your blood pressure. We have contacted your pharmacy about your glucometer test strips but they do not open until 9. We have left a message for them to call us back. When they do, I will place the correct order for the strips. I have placed a referral for opthalmology and they should contact you within the next week to schedule an eye appointment. I have also placed a referral to endocrinology with Dr. Talmage NapBalan and you should hear from them in the next couple of weeks with an appointment for diabetes follow up.   I have also put in a referral for dermatology today.  Thank you for letting me participate in your health and well being.   IF you received an x-ray today, you will receive an invoice from [facility]. Please contact [facility] at [phone] with questions or concerns regarding your invoice.   IF you received labwork today, you will receive an invoice from [facility]. Please contact [facility] at [phone] with questions or concerns regarding your invoice.   Our billing staff will not be able to assist you with questions regarding bills from these companies.  You will be contacted with the lab results as soon as they are available. The fastest way to get your results is to activate your My Chart account. Instructions are located on the last page of this paperwork. If you have not heard from us regarding the results in 2 weeks, please contact this office.

## 2017-05-20 LAB — MICROALBUMIN / CREATININE URINE RATIO
Creatinine, Urine: 69.2 mg/dL
MICROALB/CREAT RATIO: 72.1 mg/g{creat} — AB (ref 0.0–30.0)
MICROALBUM., U, RANDOM: 49.9 ug/mL

## 2017-05-20 LAB — CMP14+EGFR
A/G RATIO: 1.8 (ref 1.2–2.2)
ALK PHOS: 83 IU/L (ref 39–117)
ALT: 15 IU/L (ref 0–32)
AST: 16 IU/L (ref 0–40)
Albumin: 4.6 g/dL (ref 3.5–5.5)
BUN/Creatinine Ratio: 8 — ABNORMAL LOW (ref 9–23)
BUN: 5 mg/dL — ABNORMAL LOW (ref 6–24)
CHLORIDE: 96 mmol/L (ref 96–106)
CO2: 26 mmol/L (ref 20–29)
Calcium: 9.8 mg/dL (ref 8.7–10.2)
Creatinine, Ser: 0.6 mg/dL (ref 0.57–1.00)
GFR calc Af Amer: 124 mL/min/{1.73_m2} (ref >59)
GFR calc non Af Amer: 107 mL/min/{1.73_m2} (ref >59)
GLOBULIN, TOTAL: 2.6 g/dL (ref 1.5–4.5)
GLUCOSE: 296 mg/dL — AB (ref 65–99)
POTASSIUM: 4.5 mmol/L (ref 3.5–5.2)
SODIUM: 138 mmol/L (ref 134–144)
Total Protein: 7.2 g/dL (ref 6.0–8.5)

## 2017-07-09 ENCOUNTER — Ambulatory Visit (INDEPENDENT_AMBULATORY_CARE_PROVIDER_SITE_OTHER): Payer: BLUE CROSS/BLUE SHIELD | Admitting: Physician Assistant

## 2017-07-09 ENCOUNTER — Encounter: Payer: Self-pay | Admitting: Physician Assistant

## 2017-07-09 VITALS — BP 118/78 | HR 91 | Temp 98.7°F | Resp 16 | Ht 67.5 in | Wt 169.6 lb

## 2017-07-09 DIAGNOSIS — N764 Abscess of vulva: Secondary | ICD-10-CM

## 2017-07-09 MED ORDER — DOXYCYCLINE HYCLATE 100 MG PO TABS: 100.0000 mg | ORAL_TABLET | Freq: Two times a day (BID) | ORAL | 0 refills | Status: DC

## 2017-07-09 NOTE — Patient Instructions (Addendum)
  Change dressing if dressing is soiled. Keep covered at all times except when showering. You may get wet in the shower but do not scrub.  Apply warm compresses/heating pad to facilitate drainage. Leave packing in place. Do not apply any ointments as this may delay healing. If an antibiotic was prescribed, take as directed until finished. Return in 48 hours for wound care.   Thank you for coming in today. I hope you feel we met your needs.  Feel free to call PCP if you have any questions or further requests.  Please consider signing up for MyChart if you do not already have it, as this is a great way to communicate with me.  Best,  Whitney McVey, PA-C   IF you received an x-ray today, you will receive an invoice from [facility]. Please contact [facility] at [phone] with questions or concerns regarding your invoice.   IF you received labwork today, you will receive an invoice from [facility]. Please contact [facility] at [phone] with questions or concerns regarding your invoice.   Our billing staff will not be able to assist you with questions regarding bills from these companies.  You will be contacted with the lab results as soon as they are available. The fastest way to get your results is to activate your My Chart account. Instructions are located on the last page of this paperwork. If you have not heard from us regarding the results in 2 weeks, please contact this office.     

## 2017-07-09 NOTE — Progress Notes (Signed)
Janet Ware  MRN: [id number] DOB: 01-22-1966  PCP: Patient, No Pcp Per  Subjective:  Pt is a 51 year old female who presents to clinic for boil. Located on the right side of her labia x 1 week. "I shave a lot" and often gets bumps around her labia. Over the course of the past week this one has worsened significantly. She has been applying warm compresses and noted copious white and smelly drainage over the past few days.  Denies fever, chills, groin pain.   Review of Systems  Constitutional: Negative for chills, diaphoresis, fatigue and fever.  Genitourinary: Positive for vaginal pain (right labia). Negative for dysuria, frequency, vaginal bleeding and vaginal discharge.  Skin: Positive for wound.  Psychiatric/Behavioral: Negative for sleep disturbance.    Patient Active Problem List   Diagnosis Date Noted  . CHEST PAIN-UNSPECIFIED 08/14/2010  . DIABETES MELLITUS, TYPE II 08/13/2010  . HYPERTENSION 08/13/2010  . ASTHMA 08/13/2010  . IBS 08/13/2010  . HAIR LOSS 08/13/2010  . FATIGUE 08/13/2010  . EDEMA 08/13/2010  . WEIGHT GAIN 08/13/2010  . PALPITATIONS 08/13/2010  . DYSPNEA 08/13/2010  . WHEEZING 08/13/2010  . DIARRHEA 08/13/2010  . PELVIC  PAIN 08/13/2010    Current Outpatient Prescriptions on File Prior to Visit  Medication Sig Dispense Refill  . Albuterol Sulfate (PROAIR RESPICLICK) 027 (90 Base) MCG/ACT AEPB Inhale 1-2 puffs into the lungs as needed. 1 each 0  . atorvastatin (LIPITOR) 10 MG tablet Take 1 tablet (10 mg total) by mouth daily. 90 tablet 3  . canagliflozin (INVOKANA) 100 MG TABS tablet Take 1 tablet (100 mg total) by mouth daily before breakfast. 30 tablet 3  . Chromium Picolinate (CHROMIUM PICOLATE PO) Take 1 tablet by mouth daily.    Marland Kitchen CINNAMON PO Take 30 drops by mouth daily.    . Coconut Oil 1000 MG CAPS Take 1 capsule by mouth daily.    Marland Kitchen lisinopril (ZESTRIL) 2.5 MG tablet Take 1 tablet (2.5 mg total) by mouth daily. 90 tablet 1  . metFORMIN  (GLUCOPHAGE) 1000 MG tablet Take 1 tablet (1,000 mg total) by mouth 2 (two) times daily. 180 tablet 1  . omeprazole (PRILOSEC) 20 MG capsule Take 20 mg by mouth daily.    Marland Kitchen amphetamine-dextroamphetamine (ADDERALL) 30 MG tablet Take 30 mg by mouth daily.    . Blood Glucose Monitoring Suppl w/Device KIT 1 Units by Does not apply route daily. 1 each 0  . Blood Glucose Monitoring Suppl w/Device KIT 1 Units by Does not apply route every hour as needed (6-10 times a day). (Patient not taking: Reported on 07/09/2017) 1 each 0  . Nutritional Supplements (STATINS SUPPORT PO) Take by mouth.     No current facility-administered medications on file prior to visit.     No Known Allergies   Objective:  BP 118/78   Pulse 91   Temp 98.7 F (37.1 C) (Oral)   Resp 16   Ht 5' 7.5" (1.715 m)   Wt 169 lb 9.6 oz (76.9 kg)   LMP 07/02/2017   SpO2 97%   BMI 26.17 kg/m   Physical Exam  Constitutional: She is oriented to person, place, and time and well-developed, well-nourished, and in no distress.  Genitourinary: Vulva exhibits lesion.  Genitourinary Comments: 0.5 cm opening left vulva. No drainage with deep palpation. No induration. Wound explored and extends about 1.5cm superomedially. Wound irrigated with 10cc lidocaine and packed lightly with 1/4 in packing. Wound dressed and wound care discussed.   Neurological:  She is alert and oriented to person, place, and time. GCS score is 15.  Psychiatric: Mood, memory, affect and judgment normal.    Assessment and Plan :  1. Abscess, vulva - doxycycline (VIBRA-TABS) 100 MG tablet; Take 1 tablet (100 mg total) by mouth 2 (two) times daily.  Dispense: 20 tablet; Refill: 0 - Abscess drained at home. Wound was irrigated and lightly packed today with 1/4 in packing. No wound culture taken as there was no drainage. Wound care discussed. Start antibiotics. RTC in 48 hours for recheck.   Mercer Pod, PA-C  [facility] [place] 07/09/2017 11:06 AM

## 2017-07-12 ENCOUNTER — Ambulatory Visit (INDEPENDENT_AMBULATORY_CARE_PROVIDER_SITE_OTHER): Payer: BLUE CROSS/BLUE SHIELD | Admitting: Physician Assistant

## 2017-07-12 ENCOUNTER — Encounter: Payer: Self-pay | Admitting: Physician Assistant

## 2017-07-12 VITALS — BP 143/97 | HR 90 | Temp 98.4°F | Resp 18 | Ht 67.5 in | Wt 171.4 lb

## 2017-07-12 DIAGNOSIS — Z5189 Encounter for other specified aftercare: Secondary | ICD-10-CM

## 2017-07-12 DIAGNOSIS — N764 Abscess of vulva: Secondary | ICD-10-CM

## 2017-07-12 NOTE — Patient Instructions (Signed)
     IF you received an x-ray today, you will receive an invoice from [facility]. Please contact [facility] at [phone] with questions or concerns regarding your invoice.   IF you received labwork today, you will receive an invoice from [facility]. Please contact [facility] at [phone] with questions or concerns regarding your invoice.   Our billing staff will not be able to assist you with questions regarding bills from these companies.  You will be contacted with the lab results as soon as they are available. The fastest way to get your results is to activate your My Chart account. Instructions are located on the last page of this paperwork. If you have not heard from us regarding the results in 2 weeks, please contact this office.     

## 2017-07-14 NOTE — Progress Notes (Signed)
Janet Ware  MRN: [id number] DOB: 1966/07/31  PCP: Patient, No Pcp Per  Subjective:  Pt presents to clinic for f/u abscess. She was here on 8/17 for this problems and and packing applied to self-expressed abscess on her vulva.  She is still taking her antibiotic. Denies medication side effects.  She feels much better.  Review of Systems  Constitutional: Negative for chills, diaphoresis, fatigue and fever.  HENT: Negative for congestion, postnasal drip, rhinorrhea, sinus pressure, sneezing and sore throat.   Respiratory: Negative for cough, chest tightness, shortness of breath and wheezing.   Cardiovascular: Negative for chest pain and palpitations.  Gastrointestinal: Negative for abdominal pain, diarrhea, nausea and vomiting.  Neurological: Negative for weakness, light-headedness and headaches.    Patient Active Problem List   Diagnosis Date Noted  . CHEST PAIN-UNSPECIFIED 08/14/2010  . DIABETES MELLITUS, TYPE II 08/13/2010  . HYPERTENSION 08/13/2010  . ASTHMA 08/13/2010  . IBS 08/13/2010  . HAIR LOSS 08/13/2010  . FATIGUE 08/13/2010  . EDEMA 08/13/2010  . WEIGHT GAIN 08/13/2010  . PALPITATIONS 08/13/2010  . DYSPNEA 08/13/2010  . WHEEZING 08/13/2010  . DIARRHEA 08/13/2010  . PELVIC  PAIN 08/13/2010    Current Outpatient Prescriptions on File Prior to Visit  Medication Sig Dispense Refill  . Albuterol Sulfate (PROAIR RESPICLICK) 841 (90 Base) MCG/ACT AEPB Inhale 1-2 puffs into the lungs as needed. 1 each 0  . atorvastatin (LIPITOR) 10 MG tablet Take 1 tablet (10 mg total) by mouth daily. 90 tablet 3  . Blood Glucose Monitoring Suppl w/Device KIT 1 Units by Does not apply route daily. 1 each 0  . canagliflozin (INVOKANA) 100 MG TABS tablet Take 1 tablet (100 mg total) by mouth daily before breakfast. 30 tablet 3  . Chromium Picolinate (CHROMIUM PICOLATE PO) Take 1 tablet by mouth daily.    Marland Kitchen CINNAMON PO Take 30 drops by mouth daily.    . Coconut Oil 1000 MG CAPS Take 1  capsule by mouth daily.    Marland Kitchen doxycycline (VIBRA-TABS) 100 MG tablet Take 1 tablet (100 mg total) by mouth 2 (two) times daily. 20 tablet 0  . ibuprofen (ADVIL,MOTRIN) 200 MG tablet Take 200 mg by mouth every 6 (six) hours as needed.    Marland Kitchen lisinopril (ZESTRIL) 2.5 MG tablet Take 1 tablet (2.5 mg total) by mouth daily. 90 tablet 1  . metFORMIN (GLUCOPHAGE) 1000 MG tablet Take 1 tablet (1,000 mg total) by mouth 2 (two) times daily. 180 tablet 1  . Nutritional Supplements (STATINS SUPPORT PO) Take by mouth.    Marland Kitchen omeprazole (PRILOSEC) 20 MG capsule Take 20 mg by mouth daily.    Marland Kitchen amphetamine-dextroamphetamine (ADDERALL) 30 MG tablet Take 30 mg by mouth daily.    . Blood Glucose Monitoring Suppl w/Device KIT 1 Units by Does not apply route every hour as needed (6-10 times a day). (Patient not taking: Reported on 07/09/2017) 1 each 0   No current facility-administered medications on file prior to visit.     No Known Allergies   Objective:  BP (!) 143/97   Pulse 90   Temp 98.4 F (36.9 C) (Oral)   Resp 18   Ht 5' 7.5" (1.715 m)   Wt 171 lb 6.4 oz (77.7 kg)   LMP 07/02/2017   SpO2 94%   BMI 26.45 kg/m   Physical Exam  Constitutional: She is oriented to person, place, and time and well-developed, well-nourished, and in no distress. No distress.  Genitourinary: Vulva exhibits lesion.  Genitourinary Comments:  No packing in place. Wound is healing well. Granulation tissue is present. No drainage with light or deep palpation.   Neurological: She is alert and oriented to person, place, and time. GCS score is 15.  Skin: Skin is warm and dry.  Psychiatric: Mood, memory, affect and judgment normal.  Vitals reviewed.   Assessment and Plan :  1. Abscess, vulva 2. Encounter for wound care - Abscess is healing well. No need for repacking today. Con't course of antibiotics. RTC PRN.   Mercer Pod, PA-C  [facility] [place] 07/14/2017 2:27 PM

## 2017-10-05 ENCOUNTER — Other Ambulatory Visit: Payer: Self-pay | Admitting: Physician Assistant

## 2017-10-05 DIAGNOSIS — E119 Type 2 diabetes mellitus without complications: Secondary | ICD-10-CM

## 2017-11-13 ENCOUNTER — Other Ambulatory Visit: Payer: Self-pay | Admitting: Physician Assistant

## 2017-11-13 DIAGNOSIS — E119 Type 2 diabetes mellitus without complications: Secondary | ICD-10-CM

## 2017-11-14 ENCOUNTER — Other Ambulatory Visit: Payer: Self-pay | Admitting: Physician Assistant

## 2017-11-14 DIAGNOSIS — E119 Type 2 diabetes mellitus without complications: Secondary | ICD-10-CM

## 2018-02-09 ENCOUNTER — Other Ambulatory Visit: Payer: Self-pay

## 2018-02-09 ENCOUNTER — Ambulatory Visit: Payer: BLUE CROSS/BLUE SHIELD | Admitting: Physician Assistant

## 2018-02-09 ENCOUNTER — Encounter: Payer: Self-pay | Admitting: Physician Assistant

## 2018-02-09 VITALS — BP 142/88 | HR 89 | Temp 98.8°F | Resp 16 | Ht 67.0 in | Wt 174.4 lb

## 2018-02-09 DIAGNOSIS — M62838 Other muscle spasm: Secondary | ICD-10-CM

## 2018-02-09 DIAGNOSIS — T7840XA Allergy, unspecified, initial encounter: Secondary | ICD-10-CM | POA: Diagnosis not present

## 2018-02-09 DIAGNOSIS — E118 Type 2 diabetes mellitus with unspecified complications: Secondary | ICD-10-CM

## 2018-02-09 DIAGNOSIS — R6889 Other general symptoms and signs: Secondary | ICD-10-CM | POA: Diagnosis not present

## 2018-02-09 LAB — POCT URINALYSIS DIP (MANUAL ENTRY)
Bilirubin, UA: NEGATIVE
Blood, UA: NEGATIVE
Glucose, UA: 500 mg/dL — AB
Leukocytes, UA: NEGATIVE
Nitrite, UA: NEGATIVE
Protein Ur, POC: NEGATIVE mg/dL
Spec Grav, UA: 1.015 (ref 1.010–1.025)
Urobilinogen, UA: 0.2 E.U./dL
pH, UA: 5.5 (ref 5.0–8.0)

## 2018-02-09 LAB — POC MICROSCOPIC URINALYSIS (UMFC): Mucus: ABSENT

## 2018-02-09 MED ORDER — TIZANIDINE HCL 4 MG PO TABS: 4.0000 mg | ORAL_TABLET | Freq: Four times a day (QID) | ORAL | 0 refills | Status: AC | PRN

## 2018-02-09 MED ORDER — DAPAGLIFLOZIN PROPANEDIOL 10 MG PO TABS: 10.0000 mg | ORAL_TABLET | Freq: Every day | ORAL | 1 refills | Status: DC

## 2018-02-09 NOTE — Progress Notes (Signed)
Janet Ware  MRN: [id number] DOB: [birth year]  PCP: Patient, No Pcp Per  Subjective:  Pt is a 52 year old female PMH IBS, HTN, DM, asthma who presents to clinic for chattering teeth x 1 week. Episodes do not occur every day, however have been happening more often of recent.  She spoke with Dr. Chalmers Cater today (endocrinologist) told her to stop Invokana. Next appt is in 5 months.  She has never tried another medication for DM.  She is taking Metformin 1,066m bid in addition to [place]  She is no longer taking Adderall.  ROS below  Review of Systems  Constitutional: Negative for chills, diaphoresis, fatigue and fever.  HENT: Negative for dental problem, drooling, ear pain, sore throat, tinnitus, trouble swallowing and voice change.   Neurological: Positive for tremors ("teeth chattering"). Negative for dizziness, speech difficulty, weakness, light-headedness and headaches.    Patient Active Problem List   Diagnosis Date Noted  . CHEST PAIN-UNSPECIFIED 08/14/2010  . DIABETES MELLITUS, TYPE II 08/13/2010  . HYPERTENSION 08/13/2010  . ASTHMA 08/13/2010  . IBS 08/13/2010  . HAIR LOSS 08/13/2010  . FATIGUE 08/13/2010  . EDEMA 08/13/2010  . WEIGHT GAIN 08/13/2010  . PALPITATIONS 08/13/2010  . DYSPNEA 08/13/2010  . WHEEZING 08/13/2010  . DIARRHEA 08/13/2010  . PELVIC  PAIN 08/13/2010    Current Outpatient Medications on File Prior to Visit  Medication Sig Dispense Refill  . atorvastatin (LIPITOR) 10 MG tablet Take 1 tablet (10 mg total) by mouth daily. 90 tablet 3  . Blood Glucose Monitoring Suppl w/Device KIT 1 Units by Does not apply route daily. 1 each 0  . Blood Glucose Monitoring Suppl w/Device KIT 1 Units by Does not apply route every hour as needed (6-10 times a day). 1 each 0  . CINNAMON PO Take 30 drops by mouth daily.    .Marland Kitchenlisinopril (PRINIVIL,ZESTRIL) 2.5 MG tablet Take 1 tablet (2.5 mg total) by mouth daily. Office visit needed 90 tablet 0  . metFORMIN (GLUCOPHAGE)  1000 MG tablet Take 1 tablet (1,000 mg total) by mouth 2 (two) times daily with a meal. Office visit needed 180 tablet 0  . Nutritional Supplements (STATINS SUPPORT PO) Take by mouth.    .Marland Kitchenomeprazole (PRILOSEC) 20 MG capsule Take 20 mg by mouth daily.    . Albuterol Sulfate (PROAIR RESPICLICK) 1063(90 Base) MCG/ACT AEPB Inhale 1-2 puffs into the lungs as needed. (Patient not taking: Reported on 02/09/2018) 1 each 0  . amphetamine-dextroamphetamine (ADDERALL) 30 MG tablet Take 30 mg by mouth daily.    . Chromium Picolinate (CHROMIUM PICOLATE PO) Take 1 tablet by mouth daily.    . Coconut Oil 1000 MG CAPS Take 1 capsule by mouth daily.    .Marland Kitchenibuprofen (ADVIL,MOTRIN) 200 MG tablet Take 200 mg by mouth every 6 (six) hours as needed.    . INVOKANA 100 MG TABS tablet TAKE 1 TABLET(100 MG) BY MOUTH DAILY BEFORE BREAKFAST (Patient not taking: Reported on 02/09/2018) 30 tablet 0   No current facility-administered medications on file prior to visit.     No Known Allergies   Objective:  BP (!) 142/88   Pulse 89   Temp 98.8 F (37.1 C) (Oral)   Resp 16   Ht 5' 7"  (1.702 m)   Wt 174 lb 6.4 oz (79.1 kg)   LMP 01/06/2018   SpO2 98%   BMI 27.31 kg/m   Physical Exam  Constitutional: She is oriented to person, place, and time and well-developed, well-nourished,  and in no distress. No distress.  Cardiovascular: Normal rate, regular rhythm and normal heart sounds.  Neurological: She is alert and oriented to person, place, and time. She displays tremor. GCS score is 15.  Jaw tremor with near closure of mouth. No difficulty with phonation, swallowing. Teeth and TMJ are not TTP.   Skin: Skin is warm and dry.  Psychiatric: Mood, memory, affect and judgment normal.  Vitals reviewed.  Lab Results  Component Value Date   HGBA1C 7.2 (H) 02/09/2018    Assessment and Plan :   1. Hypersensitivity reaction, initial encounter 2. Muscle spasticity - tiZANidine (ZANAFLEX) 4 MG tablet; Take 1 tablet (4 mg  total) by mouth every 6 (six) hours as needed for muscle spasms.  Dispense: 30 tablet; Refill: 0 - Pt presents c/o "teeth chattering" x >1 week. Suspect possible hypersensitivity reaction to Invokana. D/c Invokana and start Farxiga. RTC in 2-3 weeks for recheck. She is to f/u with her endocrinologist.  3. Type 2 diabetes mellitus with complication, unspecified whether long term insulin use ([place]) - dapagliflozin propanediol (FARXIGA) 10 MG TABS tablet; Take 10 mg by mouth daily.  Dispense: 30 tablet; Refill: 1 - CMP14+EGFR - Hemoglobin A1c  4. Blood pressure alteration - POCT urinalysis dipstick - POCT Microscopic Urinalysis ([facility])   Mercer Pod, PA-C  [facility] [place] 02/09/2018 5:26 PM

## 2018-02-09 NOTE — Patient Instructions (Addendum)
Start taking Dapagliflozin 10 mg once daily - can be taken any time of day with or without food.  Start taking Zanaflex for your teeth chattering. Work on [employer], production.    come back and see me in 2-3 weeks for recheck.    Tizanidine tablets or capsules What is this medicine? TIZANIDINE (tye ZAN i deen) helps to relieve muscle spasms. It may be used to help in the treatment of multiple sclerosis and spinal cord injury. This medicine may be used for other purposes; ask your health care provider or pharmacist if you have questions. COMMON BRAND NAME(S): Zanaflex What should I tell my health care provider before I take this medicine? They need to know if you have any of these conditions: -kidney disease -liver disease -low blood pressure -mental disorder -an unusual or allergic reaction to tizanidine, other medicines, lactose (tablets only), foods, dyes, or preservatives -pregnant or trying to get pregnant -breast-feeding How should I use this medicine? Take this medicine by mouth with a full glass of water. Take this medicine on an empty stomach, at least 30 minutes before or 2 hours after food. Do not take with food unless you talk with your doctor. Follow the directions on the prescription label. Take your medicine at regular intervals. Do not take your medicine more often than directed. Do not stop taking except on your doctor's advice. Suddenly stopping the medicine can be very dangerous. Talk to your pediatrician regarding the use of this medicine in children. Patients over 40 years old may have a stronger reaction and need a smaller dose. Overdosage: If you think you have taken too much of this medicine contact a poison control center or emergency room at once. NOTE: This medicine is only for you. Do not share this medicine with others. What if I miss a dose? If you miss a dose, take it as soon as you can. If it is almost time for your next dose, take only that dose. Do  not take double or extra doses. What may interact with this medicine? Do not take this medicine with any of the following medications: -ciprofloxacin -cisapride -dofetilide -dronedarone -fluvoxamine -narcotic medicines for cough -pimozide -thiabendazole -thioridazine -ziprasidone This medicine may also interact with the following medications: -acyclovir -alcohol -antihistamines for allergy, cough and cold -baclofen -certain antibiotics like levofloxacin, ofloxacin -certain medicines for anxiety or sleep -certain medicines for blood pressure, heart disease, irregular heart beat -certain medicines for depression like amitriptyline, fluoxetine, sertraline -certain medicines for seizures like phenobarbital, primidone -certain medicines for stomach problems like cimetidine, famotidine -female hormones, like estrogens or progestins and birth control pills, patches, rings, or injections -general anesthetics like halothane, isoflurane, methoxyflurane, propofol -local anesthetics like lidocaine, pramoxine, tetracaine -medicines that relax muscles for surgery -narcotic medicines for pain -other medicines that prolong the QT interval (cause an abnormal heart rhythm) -phenothiazines like chlorpromazine, mesoridazine, prochlorperazine -ticlopidine -zileuton This list may not describe all possible interactions. Give your health care provider a list of all the medicines, herbs, non-prescription drugs, or dietary supplements you use. Also tell them if you smoke, drink alcohol, or use illegal drugs. Some items may interact with your medicine. What should I watch for while using this medicine? Tell your doctor or health care professional if your symptoms do not start to get better or if they get worse. You may get drowsy or dizzy. Do not drive, use machinery, or do anything that needs mental alertness until you know how this medicine affects you. Do not stand  or sit up quickly, especially if you  are an older patient. This reduces the risk of dizzy or fainting spells. Alcohol may interfere with the effect of this medicine. Avoid alcoholic drinks. If you are taking another medicine that also causes drowsiness, you may have more side effects. Give your health care provider a list of all medicines you use. Your doctor will tell you how much medicine to take. Do not take more medicine than directed. Call emergency for help if you have problems breathing or unusual sleepiness. Your mouth may get dry. Chewing sugarless gum or sucking hard candy, and drinking plenty of water may help. Contact your doctor if the problem does not go away or is severe. What side effects may I notice from receiving this medicine? Side effects that you should report to your doctor or health care professional as soon as possible: -allergic reactions like skin rash, itching or hives, swelling of the face, lips, or tongue -breathing problems -hallucinations -signs and symptoms of liver injury like dark yellow or brown urine; general ill feeling or flu-like symptoms; light-colored stools; loss of appetite; nausea; right upper quadrant belly pain; unusually weak or tired; yellowing of the eyes or skin -signs and symptoms of low blood pressure like dizziness; feeling faint or lightheaded, falls; unusually weak or tired -unusually slow heartbeat -unusually weak or tired Side effects that usually do not require medical attention (report to your doctor or health care professional if they continue or are bothersome): -blurred vision -constipation -dizziness -dry mouth -tiredness This list may not describe all possible side effects. Call your doctor for medical advice about side effects. You may report side effects to FDA at 1-800-FDA-1088. Where should I keep my medicine? Keep out of the reach of children. Store at room temperature between 15 and 30 degrees C (59 and 86 degrees F). Throw away any unused medicine after the  expiration date. NOTE: This sheet is a summary. It may not cover all possible information. If you have questions about this medicine, talk to your doctor, pharmacist, or health care provider.  2018 Elsevier/Gold Standard (2015-08-20 13:52:12)  Thank you for coming in today. I hope you feel we met your needs.  Feel free to call PCP if you have any questions or further requests.  Please consider signing up for MyChart if you do not already have it, as this is a great way to communicate with me.  Best,  Whitney McVey, PA-C   IF you received an x-ray today, you will receive an invoice from [facility]. Please contact [facility] at [phone] with questions or concerns regarding your invoice.   IF you received labwork today, you will receive an invoice from [facility]. Please contact [facility] at [phone] with questions or concerns regarding your invoice.   Our billing staff will not be able to assist you with questions regarding bills from these companies.  You will be contacted with the lab results as soon as they are available. The fastest way to get your results is to activate your My Chart account. Instructions are located on the last page of this paperwork. If you have not heard from [place] regarding the results in 2 weeks, please contact this office.

## 2018-02-10 LAB — CMP14+EGFR
ALT: 27 IU/L (ref 0–32)
AST: 24 IU/L (ref 0–40)
Albumin/Globulin Ratio: 1.9 (ref 1.2–2.2)
Albumin: 4.4 g/dL (ref 3.5–5.5)
Alkaline Phosphatase: 74 IU/L (ref 39–117)
BUN/Creatinine Ratio: 13 (ref 9–23)
BUN: 8 mg/dL (ref 6–24)
Bilirubin Total: 0.3 mg/dL (ref 0.0–1.2)
CO2: 24 mmol/L (ref 20–29)
Calcium: 9.4 mg/dL (ref 8.7–10.2)
Chloride: 99 mmol/L (ref 96–106)
Creatinine, Ser: 0.6 mg/dL (ref 0.57–1.00)
GFR calc Af Amer: 124 mL/min/{1.73_m2} (ref >59)
GFR calc non Af Amer: 107 mL/min/{1.73_m2} (ref >59)
Globulin, Total: 2.3 g/dL (ref 1.5–4.5)
Glucose: 182 mg/dL — ABNORMAL HIGH (ref 65–99)
Potassium: 4.8 mmol/L (ref 3.5–5.2)
Sodium: 138 mmol/L (ref 134–144)
Total Protein: 6.7 g/dL (ref 6.0–8.5)

## 2018-02-10 LAB — HEMOGLOBIN A1C
Est. average glucose Bld gHb Est-mCnc: 160 mg/dL
Hgb A1c MFr Bld: 7.2 % — ABNORMAL HIGH (ref 4.8–5.6)

## 2018-03-04 ENCOUNTER — Other Ambulatory Visit: Payer: Self-pay | Admitting: Physician Assistant

## 2018-04-22 ENCOUNTER — Other Ambulatory Visit: Payer: Self-pay | Admitting: Physician Assistant

## 2018-04-22 DIAGNOSIS — E118 Type 2 diabetes mellitus with unspecified complications: Secondary | ICD-10-CM

## 2018-05-09 ENCOUNTER — Other Ambulatory Visit: Payer: Self-pay | Admitting: Physician Assistant

## 2018-05-09 DIAGNOSIS — E119 Type 2 diabetes mellitus without complications: Secondary | ICD-10-CM

## 2018-05-17 ENCOUNTER — Other Ambulatory Visit: Payer: Self-pay | Admitting: Physician Assistant

## 2018-05-18 ENCOUNTER — Other Ambulatory Visit: Payer: Self-pay | Admitting: Physician Assistant

## 2018-05-18 DIAGNOSIS — E119 Type 2 diabetes mellitus without complications: Secondary | ICD-10-CM

## 2018-06-16 ENCOUNTER — Other Ambulatory Visit: Payer: Self-pay | Admitting: Physician Assistant

## 2018-07-13 ENCOUNTER — Other Ambulatory Visit: Payer: Self-pay | Admitting: Endocrinology

## 2018-07-13 DIAGNOSIS — R109 Unspecified abdominal pain: Secondary | ICD-10-CM

## 2018-07-18 ENCOUNTER — Encounter: Payer: BLUE CROSS/BLUE SHIELD | Admitting: Physician Assistant

## 2018-07-21 ENCOUNTER — Ambulatory Visit
Admission: RE | Admit: 2018-07-21 | Discharge: 2018-07-21 | Disposition: A | Discharge status: Home / Self Care | Payer: BLUE CROSS/BLUE SHIELD | Source: Ambulatory Visit | Attending: Endocrinology | Admitting: Endocrinology

## 2018-07-21 DIAGNOSIS — R109 Unspecified abdominal pain: Secondary | ICD-10-CM

## 2018-07-21 MED ORDER — IOPAMIDOL (ISOVUE-300) INJECTION 61%
100.0000 mL | Freq: Once | INTRAVENOUS | Status: AC | PRN
  Administered 2018-07-21: 100 mL via INTRAVENOUS

## 2018-07-29 ENCOUNTER — Other Ambulatory Visit (HOSPITAL_COMMUNITY): Payer: Self-pay | Admitting: Endocrinology

## 2018-07-29 DIAGNOSIS — M8540 Solitary bone cyst, unspecified site: Secondary | ICD-10-CM

## 2018-08-04 ENCOUNTER — Encounter (HOSPITAL_COMMUNITY): Payer: BLUE CROSS/BLUE SHIELD

## 2018-08-06 ENCOUNTER — Other Ambulatory Visit: Payer: Self-pay | Admitting: Physician Assistant

## 2018-08-06 ENCOUNTER — Telehealth: Payer: Self-pay | Admitting: Physician Assistant

## 2018-08-06 DIAGNOSIS — E118 Type 2 diabetes mellitus with unspecified complications: Secondary | ICD-10-CM

## 2018-08-06 NOTE — Telephone Encounter (Signed)
Pt. Called to request refill on Farxiga. Pt. Also expressed confusion about plan of care while on the phone . Pt. Wished to get in contact with Ms. McVey and understand what she needed to do to be on the medications regularly. Pt. Expressed violent confusion at the concept of having to call to request refills, and believe it appropriate for the practice to call her whenever she needed one.  Best number [phone]

## 2018-08-09 ENCOUNTER — Ambulatory Visit (HOSPITAL_COMMUNITY): Payer: BLUE CROSS/BLUE SHIELD

## 2018-08-09 ENCOUNTER — Encounter (HOSPITAL_COMMUNITY): Payer: BLUE CROSS/BLUE SHIELD

## 2018-08-16 ENCOUNTER — Encounter (HOSPITAL_COMMUNITY): Payer: BLUE CROSS/BLUE SHIELD

## 2018-08-22 ENCOUNTER — Encounter (HOSPITAL_COMMUNITY)
Admission: RE | Admit: 2018-08-22 | Discharge: 2018-08-22 | Disposition: A | Discharge status: Home / Self Care | Payer: BLUE CROSS/BLUE SHIELD | Source: Ambulatory Visit | Attending: Endocrinology | Admitting: Endocrinology

## 2018-08-22 DIAGNOSIS — M8540 Solitary bone cyst, unspecified site: Secondary | ICD-10-CM | POA: Diagnosis present

## 2018-08-22 MED ORDER — TECHNETIUM TC 99M MEDRONATE IV KIT
21.4000 | PACK | Freq: Once | INTRAVENOUS | Status: AC | PRN
  Administered 2018-08-22: 21.4 via INTRAVENOUS

## 2018-09-06 ENCOUNTER — Other Ambulatory Visit: Payer: Self-pay | Admitting: Physician Assistant

## 2018-09-06 DIAGNOSIS — M62838 Other muscle spasm: Secondary | ICD-10-CM

## 2018-09-06 NOTE — Telephone Encounter (Signed)
Requested medication (s) are due for refill today:   Yes  Requested medication (s) are on the active medication list:   Yes  Future visit scheduled:   No  Pt of McVey   Last ordered: 02/09/18  #30  0 refills   Requested Prescriptions  Pending Prescriptions Disp Refills   tiZANidine (ZANAFLEX) 4 MG tablet [Pharmacy Med Name: TIZANIDINE 4MG  TABLETS] 30 tablet 0    Sig: TAKE 1 TABLET(4 MG) BY MOUTH EVERY 6 HOURS AS NEEDED FOR MUSCLE SPASMS     Not Delegated - Cardiovascular:  Alpha-2 Agonists - tizanidine Failed - 09/06/2018  3:35 AM      Failed - This refill cannot be delegated      Failed - Valid encounter within last 6 months    Recent Outpatient Visits          6 months ago Hypersensitivity reaction, initial encounter   Primary Care at [facility], Madelaine Bhat, PA-C   1 year ago Abscess, vulva   Primary Care at [facility], Madelaine Bhat, PA-C   1 year ago Abscess, vulva   Primary Care at [facility], Madelaine Bhat, PA-C   1 year ago Type 2 diabetes mellitus without complication, without long-term current use of insulin [facility])   Primary Care at [place], [place] D, PA-C   1 year ago Annual physical exam   Primary Care at [place], [place] D, [place]

## 2018-10-14 ENCOUNTER — Other Ambulatory Visit: Payer: Self-pay | Admitting: Physician Assistant

## 2018-10-14 DIAGNOSIS — E118 Type 2 diabetes mellitus with unspecified complications: Secondary | ICD-10-CM

## 2018-10-17 NOTE — Telephone Encounter (Signed)
Attempted to call patient to schedule an appointment for her refill for farxiga. No answer, left message to call office and schedule an appointment.

## 2019-04-04 ENCOUNTER — Other Ambulatory Visit: Payer: Self-pay

## 2019-04-04 ENCOUNTER — Encounter: Payer: Self-pay | Admitting: Family Medicine

## 2019-04-04 ENCOUNTER — Ambulatory Visit (INDEPENDENT_AMBULATORY_CARE_PROVIDER_SITE_OTHER): Payer: BLUE CROSS/BLUE SHIELD | Admitting: Family Medicine

## 2019-04-04 VITALS — BP 120/80 | HR 94 | Temp 98.6°F | Resp 18 | Ht 67.0 in | Wt 192.0 lb

## 2019-04-04 DIAGNOSIS — L0231 Cutaneous abscess of buttock: Secondary | ICD-10-CM | POA: Diagnosis not present

## 2019-04-04 MED ORDER — CLINDAMYCIN HCL 300 MG PO CAPS: 300.0000 mg | ORAL_CAPSULE | Freq: Three times a day (TID) | ORAL | 0 refills | Status: AC

## 2019-04-04 NOTE — Patient Instructions (Addendum)
   F/u 1 week-take clindamycin TID-avoid bath and heat Cotton underpants, avoid tight clothing and extended sitting  If you have lab work done today you will be contacted with your lab results within the next 2 weeks.  If you have not heard from [place] then please contact us. The fastest way to get your results is to register for My Chart.   IF you received an x-ray today, you will receive an invoice from [facility]. Please contact [facility] at [phone] with questions or concerns regarding your invoice.   IF you received labwork today, you will receive an invoice from [facility]. Please contact [facility] at [phone] with questions or concerns regarding your invoice.   Our billing staff will not be able to assist you with questions regarding bills from these companies.  You will be contacted with the lab results as soon as they are available. The fastest way to get your results is to activate your My Chart account. Instructions are located on the last page of this paperwork. If you have not heard from [place] regarding the results in 2 weeks, please contact this office.

## 2019-04-04 NOTE — Progress Notes (Signed)
Acute Office Visit  Subjective:    Patient ID: Janet Ware, female    DOB: 1965/12/26, 53 y.o.   MRN: [id number]  Chief Complaint  Patient presents with  . Recurrent Skin Infections    right side of buttocks  x2 months.     HPI Patient is in today for for abscess. Pt states noted previously abscess with I and D needed including packing and stents. Pt states ongoing drainage with heat and salts to area. Pt states not drainage in several days. Sensitive area with sitting. Pt states hair removal improved abscesses in urogenital area. Pt with DM-glucose today 125 in office-self check  Past Medical History:  Diagnosis Date  . Allergy   . Anxiety   . Asthma   . Depression   . Diabetes mellitus   . Hypertension   . IBS (irritable bowel syndrome)   . Multiple allergies   . Muscle cramps at night   . Panic attacks   . Recurrent upper respiratory infection (URI)    3 wks ago- finished abx  . Shortness of breath     Past Surgical History:  Procedure Laterality Date  . DILATION AND CURETTAGE OF UTERUS     x 2  . RHINOPLASTY    . TUBAL LIGATION    . WISDOM TOOTH EXTRACTION      Family History  Problem Relation Age of Onset  . Diabetes Mother   . Diabetes Father   . Diabetes Maternal Grandmother   . Diabetes Paternal Grandfather     Social History   Socioeconomic History  . Marital status: Divorced    Spouse name: Not on file  . Number of children: Not on file  . Years of education: Not on file  . Highest education level: Not on file  Occupational History  . Not on file  Social Needs  . Financial resource strain: Not on file  . Food insecurity:    Worry: Not on file    Inability: Not on file  . Transportation needs:    Medical: Not on file    Non-medical: Not on file  Tobacco Use  . Smoking status: Never Smoker  . Smokeless tobacco: Never Used  Substance and Sexual Activity  . Alcohol use: Yes    Alcohol/week: 16.0 standard drinks    Types: 15 Glasses of  wine, 1 Cans of beer per week  . Drug use: No  . Sexual activity: Yes    Birth control/protection: Surgical  Lifestyle  . Physical activity:    Days per week: Not on file    Minutes per session: Not on file  . Stress: Not on file  Relationships  . Social connections:    Talks on phone: Not on file    Gets together: Not on file    Attends religious service: Not on file    Active member of club or organization: Not on file    Attends meetings of clubs or organizations: Not on file    Relationship status: Not on file  . Intimate partner violence:    Fear of current or ex partner: Not on file    Emotionally abused: Not on file    Physically abused: Not on file    Forced sexual activity: Not on file  Other Topics Concern  . Not on file  Social History Narrative  . Not on file    Outpatient Medications Prior to Visit  Medication Sig Dispense Refill  . atorvastatin (LIPITOR) 10 MG tablet  TAKE 1 TABLET BY MOUTH DAILY 30 tablet 0  . Blood Glucose Monitoring Suppl w/Device KIT 1 Units by Does not apply route daily. 1 each 0  . Blood Glucose Monitoring Suppl w/Device KIT 1 Units by Does not apply route every hour as needed (6-10 times a day). 1 each 0  . CINNAMON PO Take 30 drops by mouth daily.    Marland Kitchen FARXIGA 10 MG TABS tablet TAKE 1 TABLET BY MOUTH DAILY 30 tablet 0  . ibuprofen (ADVIL,MOTRIN) 200 MG tablet Take 200 mg by mouth every 6 (six) hours as needed.    Marland Kitchen lisinopril (PRINIVIL,ZESTRIL) 2.5 MG tablet Take 1 tablet (2.5 mg total) by mouth daily. Office visit needed 90 tablet 0  . metFORMIN (GLUCOPHAGE) 1000 MG tablet Take 1 tablet (1,000 mg total) by mouth 2 (two) times daily with a meal. Office visit needed 180 tablet 0  . Nutritional Supplements (STATINS SUPPORT PO) Take by mouth.    Marland Kitchen omeprazole (PRILOSEC) 20 MG capsule Take 20 mg by mouth daily.    Marland Kitchen tiZANidine (ZANAFLEX) 4 MG tablet Take 1 tablet (4 mg total) by mouth every 6 (six) hours as needed for muscle spasms. 30 tablet 0   . INVOKANA 100 MG TABS tablet TAKE 1 TABLET(100 MG) BY MOUTH DAILY BEFORE BREAKFAST (Patient not taking: Reported on 02/09/2018) 30 tablet 0   No facility-administered medications prior to visit.     Allergies  Allergen Reactions  . Invokana [Canagliflozin]     ROS CONSTITUTIONAL: no  fever INTEG: abscess right buttock ENDO: glucose well controlled     Objective:    Physical Exam  Constitutional: She appears well-developed and well-nourished.  Skin: There is erythema.  2x4 xcm abscess right buttock associated eryth-unable to I and D due to previous manipulation  BP 120/80   Pulse 94   Temp 98.6 F (37 C) (Oral)   Resp 18   Ht 5' 7"  (1.702 m)   Wt 192 lb (87.1 kg)   LMP 11/30/2018   SpO2 94%   BMI 30.07 kg/m  Wt Readings from Last 3 Encounters:  04/04/19 192 lb (87.1 kg)  02/09/18 174 lb 6.4 oz (79.1 kg)  07/12/17 171 lb 6.4 oz (77.7 kg)    Health Maintenance Due  Topic Date Due  . PNEUMOCOCCAL POLYSACCHARIDE VACCINE AGE 75-64 HIGH RISK  04/11/1970  . OPHTHALMOLOGY EXAM  04/11/1978  . FOOT EXAM  01/12/2018  . PAP SMEAR-Modifier  01/12/2018  . MAMMOGRAM  04/11/2018  . COLONOSCOPY  04/11/2018  . HEMOGLOBIN A1C  08/12/2018    There are no preventive care reminders to display for this patient.   Lab Results  Component Value Date   TSH 0.970 01/12/2017   Lab Results  Component Value Date   WBC 4.8 01/12/2017   HGB 14.9 01/12/2017   HCT 42.7 01/12/2017   MCV 87 01/12/2017   PLT 387 (H) 01/12/2017   Lab Results  Component Value Date   NA 138 02/09/2018   K 4.8 02/09/2018   CO2 24 02/09/2018   GLUCOSE 182 (H) 02/09/2018   BUN 8 02/09/2018   CREATININE 0.60 02/09/2018   BILITOT 0.3 02/09/2018   ALKPHOS 74 02/09/2018   AST 24 02/09/2018   ALT 27 02/09/2018   PROT 6.7 02/09/2018   ALBUMIN 4.4 02/09/2018   CALCIUM 9.4 02/09/2018   ANIONGAP 9 11/07/2015   Lab Results  Component Value Date   CHOL 198 01/12/2017   Lab Results  Component Value  Date  HDL 52 01/12/2017   Lab Results  Component Value Date   LDLCALC 113 (H) 01/12/2017   Lab Results  Component Value Date   TRIG 164 (H) 01/12/2017   Lab Results  Component Value Date   CHOLHDL 3.8 01/12/2017   Lab Results  Component Value Date   HGBA1C 7.2 (H) 02/09/2018       Assessment & Plan:  Abscess of buttock, right unable to complete I and D due to manipulation-d/w pt antibiotics and return to clinic in 1 week Avoid baths, manipulation, heat and extended sitting    Meds ordered this encounter  Medications  . clindamycin (CLEOCIN) 300 MG capsule    Sig: Take 1 capsule (300 mg total) by mouth 3 (three) times daily.    Dispense:  30 capsule    Refill:  0     Rosaly Labarbera Hannah Beat, MD

## 2019-04-11 ENCOUNTER — Other Ambulatory Visit: Payer: Self-pay

## 2019-04-11 ENCOUNTER — Ambulatory Visit (INDEPENDENT_AMBULATORY_CARE_PROVIDER_SITE_OTHER): Payer: BLUE CROSS/BLUE SHIELD | Admitting: Family Medicine

## 2019-04-11 ENCOUNTER — Encounter: Payer: Self-pay | Admitting: Family Medicine

## 2019-04-11 VITALS — BP 148/80 | HR 98 | Temp 98.0°F | Resp 16 | Ht 67.0 in | Wt 192.0 lb

## 2019-04-11 DIAGNOSIS — L0231 Cutaneous abscess of buttock: Secondary | ICD-10-CM

## 2019-04-11 NOTE — Patient Instructions (Signed)
° ° ° °  If you have lab work done today you will be contacted with your lab results within the next 2 weeks.  If you have not heard from us then please contact us. The fastest way to get your results is to register for My Chart. ° ° °IF you received an x-ray today, you will receive an invoice from [facility]. Please contact [facility] at [phone] with questions or concerns regarding your invoice.  ° °IF you received labwork today, you will receive an invoice from [facility]. Please contact [facility] at [phone] with questions or concerns regarding your invoice.  ° °Our billing staff will not be able to assist you with questions regarding bills from these companies. ° °You will be contacted with the lab results as soon as they are available. The fastest way to get your results is to activate your My Chart account. Instructions are located on the last page of this paperwork. If you have not heard from us regarding the results in 2 weeks, please contact this office. °  ° ° ° °

## 2019-04-11 NOTE — Progress Notes (Signed)
Acute Office Visit  Subjective:    Patient ID: Janet Ware, female    DOB: 09/06/1966, 53 y.o.   MRN: [id number]  Chief Complaint  Patient presents with  . Abscess    need to follow-up from 5/12, pt states the area looks and feels better.     HPI Patient is in today for abscess. Pt taking clindamycin with no concerns. Glucose has been slightly higher. Diarrhea noted with start of antibiotics but has resolved-pt has 4 more days of medication. Tenderness resolving.  Past Medical History:  Diagnosis Date  . Allergy   . Anxiety   . Asthma   . Depression   . Diabetes mellitus   . Hypertension   . IBS (irritable bowel syndrome)   . Multiple allergies   . Muscle cramps at night   . Panic attacks   . Recurrent upper respiratory infection (URI)    3 wks ago- finished abx  . Shortness of breath     Past Surgical History:  Procedure Laterality Date  . DILATION AND CURETTAGE OF UTERUS     x 2  . RHINOPLASTY    . TUBAL LIGATION    . WISDOM TOOTH EXTRACTION      Family History  Problem Relation Age of Onset  . Diabetes Mother   . Diabetes Father   . Diabetes Maternal Grandmother   . Diabetes Paternal Grandfather     Social History   Socioeconomic History  . Marital status: Divorced    Spouse name: Not on file  . Number of children: Not on file  . Years of education: Not on file  . Highest education level: Not on file  Occupational History  . Not on file  Social Needs  . Financial resource strain: Not on file  . Food insecurity:    Worry: Not on file    Inability: Not on file  . Transportation needs:    Medical: Not on file    Non-medical: Not on file  Tobacco Use  . Smoking status: Never Smoker  . Smokeless tobacco: Never Used  Substance and Sexual Activity  . Alcohol use: Yes    Alcohol/week: 16.0 standard drinks    Types: 15 Glasses of wine, 1 Cans of beer per week  . Drug use: No  . Sexual activity: Yes    Birth control/protection: Surgical   Lifestyle  . Physical activity:    Days per week: Not on file    Minutes per session: Not on file  . Stress: Not on file  Relationships  . Social connections:    Talks on phone: Not on file    Gets together: Not on file    Attends religious service: Not on file    Active member of club or organization: Not on file    Attends meetings of clubs or organizations: Not on file    Relationship status: Not on file  . Intimate partner violence:    Fear of current or ex partner: Not on file    Emotionally abused: Not on file    Physically abused: Not on file    Forced sexual activity: Not on file  Other Topics Concern  . Not on file  Social History Narrative  . Not on file    Outpatient Medications Prior to Visit  Medication Sig Dispense Refill  . atorvastatin (LIPITOR) 10 MG tablet TAKE 1 TABLET BY MOUTH DAILY 30 tablet 0  . Blood Glucose Monitoring Suppl w/Device KIT 1 Units by Does  not apply route daily. 1 each 0  . Blood Glucose Monitoring Suppl w/Device KIT 1 Units by Does not apply route every hour as needed (6-10 times a day). 1 each 0  . CINNAMON PO Take 30 drops by mouth daily.    . clindamycin (CLEOCIN) 300 MG capsule Take 1 capsule (300 mg total) by mouth 3 (three) times daily. 30 capsule 0  . FARXIGA 10 MG TABS tablet TAKE 1 TABLET BY MOUTH DAILY 30 tablet 0  . ibuprofen (ADVIL,MOTRIN) 200 MG tablet Take 200 mg by mouth every 6 (six) hours as needed.    . INVOKANA 300 MG TABS tablet TK 1 T PO QD    . lisinopril (PRINIVIL,ZESTRIL) 2.5 MG tablet Take 1 tablet (2.5 mg total) by mouth daily. Office visit needed 90 tablet 0  . metFORMIN (GLUCOPHAGE) 1000 MG tablet Take 1 tablet (1,000 mg total) by mouth 2 (two) times daily with a meal. Office visit needed 180 tablet 0  . NOVOLOG FLEXPEN 100 UNIT/ML FlexPen ADM 3 TO 5 UNI [place] TID    . Nutritional Supplements (STATINS SUPPORT PO) Take by mouth.    Marland Kitchen omeprazole (PRILOSEC) 20 MG capsule Take 20 mg by mouth daily.    Marland Kitchen tiZANidine  (ZANAFLEX) 4 MG tablet Take 1 tablet (4 mg total) by mouth every 6 (six) hours as needed for muscle spasms. 30 tablet 0  . TRESIBA FLEXTOUCH 100 UNIT/ML SOPN FlexTouch Pen INJECT 10 UNITS UNDER THE SKIN ONCE A DAY AS DIRECTED    . INVOKANA 100 MG TABS tablet TAKE 1 TABLET(100 MG) BY MOUTH DAILY BEFORE BREAKFAST (Patient not taking: Reported on 02/09/2018) 30 tablet 0   No facility-administered medications prior to visit.     Allergies  Allergen Reactions  . Invokana [Canagliflozin]     ROS CONSTITUTIONAL: no fever INTEG: abscess site resolved-slightly tender to palpation     Objective:    Physical Exam Abscess right buttocks-hyperpigmentation of skin resolving, no eryth, no drainage BP (!) 148/80   Pulse 98   Temp 98 F (36.7 C) (Oral)   Resp 16   Ht 5' 7"  (1.702 m)   Wt 192 lb (87.1 kg)   SpO2 98%   BMI 30.07 kg/m  Wt Readings from Last 3 Encounters:  04/11/19 192 lb (87.1 kg)  04/04/19 192 lb (87.1 kg)  02/09/18 174 lb 6.4 oz (79.1 kg)    Health Maintenance Due  Topic Date Due  . PNEUMOCOCCAL POLYSACCHARIDE VACCINE AGE 60-64 HIGH RISK  04/11/1970  . OPHTHALMOLOGY EXAM  04/11/1978  . FOOT EXAM  01/12/2018  . PAP SMEAR-Modifier  01/12/2018  . MAMMOGRAM  04/11/2018  . COLONOSCOPY  04/11/2018  . HEMOGLOBIN A1C  08/12/2018     Lab Results  Component Value Date   TSH 0.970 01/12/2017   Lab Results  Component Value Date   WBC 4.8 01/12/2017   HGB 14.9 01/12/2017   HCT 42.7 01/12/2017   MCV 87 01/12/2017   PLT 387 (H) 01/12/2017   Lab Results  Component Value Date   NA 138 02/09/2018   K 4.8 02/09/2018   CO2 24 02/09/2018   GLUCOSE 182 (H) 02/09/2018   BUN 8 02/09/2018   CREATININE 0.60 02/09/2018   BILITOT 0.3 02/09/2018   ALKPHOS 74 02/09/2018   AST 24 02/09/2018   ALT 27 02/09/2018   PROT 6.7 02/09/2018   ALBUMIN 4.4 02/09/2018   CALCIUM 9.4 02/09/2018   ANIONGAP 9 11/07/2015   Lab Results  Component Value Date  CHOL 198 01/12/2017   Lab  Results  Component Value Date   HDL 52 01/12/2017   Lab Results  Component Value Date   LDLCALC 113 (H) 01/12/2017   Lab Results  Component Value Date   TRIG 164 (H) 01/12/2017   Lab Results  Component Value Date   CHOLHDL 3.8 01/12/2017   Lab Results  Component Value Date   HGBA1C 7.2 (H) 02/09/2018       Assessment & Plan:    1. Abscess of buttock, right Resolved-continue doxy. Avoid tight clothing    Zylon Creamer Hannah Beat, MD

## 2019-05-31 IMAGING — NM NM BONE WHOLE BODY
4 series · 4 of 4 positions shown · non-contrast
Comparison: CT 07/21/2018.

CLINICAL DATA: Bone lesions on CT.

EXAM:
NUCLEAR MEDICINE WHOLE BODY BONE SCAN
TECHNIQUE: Whole body anterior and posterior images were obtained approximately
3 hours after intravenous injection of radiopharmaceutical.
RADIOPHARMACEUTICALS:  21.4 mCi Zechnetium-RRm MDP IV

[Series 1: whole body · 2.66mm/px · 1 of 1 slices shown (1 of 2)]
[im 1/1]
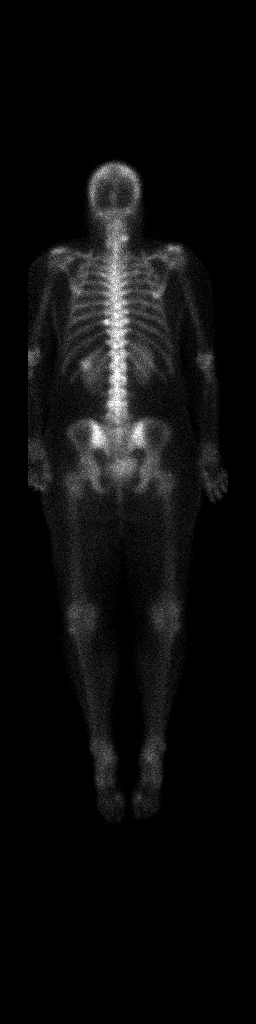

[Series 1: whole body · 2.66mm/px · 1 of 1 slices shown (2 of 2)]
[im 1/1]
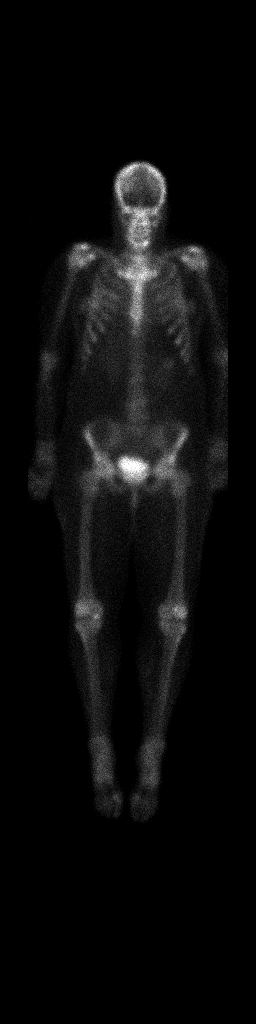

[Series 2: bone static · 2.07mm/px · 1 of 1 slices shown (1 of 2)]
[im 1/1]
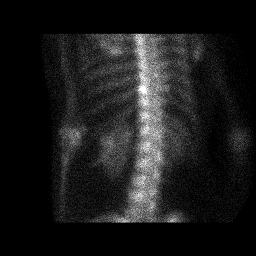

[Series 2: bone static · 2.07mm/px · 1 of 1 slices shown (2 of 2)]
[im 1/1]
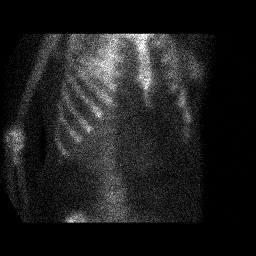

[4 of 4 positions shown; findings below may reference images not displayed]

FINDINGS: Bilateral renal function excretion. Increase activity noted over the
left kidney is consistent with previously identified prominent
calyceal diverticulum. Focal areas of increased activity noted over
the right mid cervical spine in the left midthoracic
spine/costovertebral region. Cervicothoracic spine series suggest
for further evaluation. Punctate area of increased activity noted
over the left mandible. Clinical correlation suggested. This may be
related to dental disease. Mild increased activity noted over both
shoulders and knees most likely degenerative. No abnormal activity
noted in the bony pelvis to car spine to previously identified
sclerotic foci. These sclerotic foci may represent bone islands.
IMPRESSION: 1. Focal area of increased activity right mid cervical spine and
left midthoracic spine/costovertebral region. Cervical and thoracic
spine series suggested for further evaluation.

2. Focal area of increased activity noted over the left mandible,
possibly related to dental disease. Clinical correlation suggested.

3. No abnormal bony pelvic activity noted to account for previously
identified findings on CT. Small sclerotic densities in the right
pelvis noted on recent CT may represent bone islands.

## 2019-08-09 ENCOUNTER — Emergency Department (HOSPITAL_COMMUNITY)
Admission: EM | Admit: 2019-08-09 | Discharge: 2019-08-09 | Disposition: A | Discharge status: Home / Self Care | Payer: BC Managed Care – PPO | Attending: Emergency Medicine | Admitting: Emergency Medicine

## 2019-08-09 ENCOUNTER — Encounter (HOSPITAL_COMMUNITY): Payer: Self-pay | Admitting: Obstetrics and Gynecology

## 2019-08-09 ENCOUNTER — Other Ambulatory Visit: Payer: Self-pay

## 2019-08-09 DIAGNOSIS — R1031 Right lower quadrant pain: Secondary | ICD-10-CM | POA: Insufficient documentation

## 2019-08-09 DIAGNOSIS — Z5321 Procedure and treatment not carried out due to patient leaving prior to being seen by health care provider: Secondary | ICD-10-CM | POA: Insufficient documentation

## 2019-08-09 MED ORDER — SODIUM CHLORIDE 0.9% FLUSH: 3.0000 mL | Freq: Once | INTRAVENOUS | Status: DC

## 2019-08-09 NOTE — ED Triage Notes (Signed)
Patient reports she has diabetes, abdominal pain, and an abscess that she is being seen for at [facility] surgery. Patient reports her main complaint is the RLQ abdominal pain. Patient reports it started this morning around 0600. Patient reports tenderness and pain with walking. Pt is afebrile

## 2019-09-19 LAB — HM DIABETES EYE EXAM

## 2019-12-02 ENCOUNTER — Other Ambulatory Visit (HOSPITAL_COMMUNITY): Payer: BC Managed Care – PPO

## 2020-07-04 ENCOUNTER — Encounter (HOSPITAL_BASED_OUTPATIENT_CLINIC_OR_DEPARTMENT_OTHER): Payer: BC Managed Care – PPO | Admitting: Internal Medicine

## 2022-03-13 ENCOUNTER — Other Ambulatory Visit: Payer: Self-pay | Admitting: Endocrinology

## 2022-03-13 DIAGNOSIS — E7800 Pure hypercholesterolemia, unspecified: Secondary | ICD-10-CM

## 2022-04-16 ENCOUNTER — Other Ambulatory Visit: Payer: BC Managed Care – PPO

## 2022-06-04 ENCOUNTER — Ambulatory Visit
Admission: RE | Admit: 2022-06-04 | Discharge: 2022-06-04 | Disposition: A | Discharge status: Home / Self Care | Payer: No Typology Code available for payment source | Source: Ambulatory Visit | Attending: Endocrinology | Admitting: Endocrinology

## 2022-06-04 DIAGNOSIS — E7800 Pure hypercholesterolemia, unspecified: Secondary | ICD-10-CM
